# Patient Record
Sex: Female | Born: 1965 | Race: Black or African American | Hispanic: No | Marital: Married | Smoking: Never smoker
Health system: Southern US, Community
[De-identification: ages and names within clinical notes are randomized; demographics above are authoritative.]

## PROBLEM LIST (undated history)

## (undated) DIAGNOSIS — Z9049 Acquired absence of other specified parts of digestive tract: Secondary | ICD-10-CM

## (undated) DIAGNOSIS — N200 Calculus of kidney: Secondary | ICD-10-CM

## (undated) HISTORY — PX: ABDOMINAL HYSTERECTOMY: SHX81

## (undated) HISTORY — PX: APPENDECTOMY: SHX54

## (undated) HISTORY — PX: KIDNEY SURGERY: SHX687

## (undated) HISTORY — DX: Acquired absence of other specified parts of digestive tract: Z90.49

## (undated) HISTORY — PX: TOTAL ABDOMINAL HYSTERECTOMY: SHX209

## (undated) HISTORY — DX: Calculus of kidney: N20.0

---

## 2018-01-30 DIAGNOSIS — M5417 Radiculopathy, lumbosacral region: Secondary | ICD-10-CM | POA: Insufficient documentation

## 2018-01-30 DIAGNOSIS — M5126 Other intervertebral disc displacement, lumbar region: Secondary | ICD-10-CM | POA: Insufficient documentation

## 2020-11-09 DIAGNOSIS — N200 Calculus of kidney: Secondary | ICD-10-CM | POA: Insufficient documentation

## 2021-11-16 ENCOUNTER — Emergency Department (INDEPENDENT_AMBULATORY_CARE_PROVIDER_SITE_OTHER)
Admission: EM | Admit: 2021-11-16 | Discharge: 2021-11-16 | Disposition: A | Discharge status: Home / Self Care | Payer: PRIVATE HEALTH INSURANCE | Source: Home / Self Care | Attending: Family Medicine | Admitting: Family Medicine

## 2021-11-16 ENCOUNTER — Emergency Department (INDEPENDENT_AMBULATORY_CARE_PROVIDER_SITE_OTHER): Payer: PRIVATE HEALTH INSURANCE

## 2021-11-16 ENCOUNTER — Other Ambulatory Visit: Payer: Self-pay

## 2021-11-16 DIAGNOSIS — R109 Unspecified abdominal pain: Secondary | ICD-10-CM

## 2021-11-16 DIAGNOSIS — R1012 Left upper quadrant pain: Secondary | ICD-10-CM

## 2021-11-16 DIAGNOSIS — K5909 Other constipation: Secondary | ICD-10-CM

## 2021-11-16 LAB — POCT URINALYSIS DIP (MANUAL ENTRY)
Bilirubin, UA: NEGATIVE
Blood, UA: NEGATIVE
Glucose, UA: NEGATIVE mg/dL
Ketones, POC UA: NEGATIVE mg/dL
Leukocytes, UA: NEGATIVE
Nitrite, UA: NEGATIVE
Protein Ur, POC: NEGATIVE mg/dL
Spec Grav, UA: >=1.03 — AB (ref 1.010–1.025)
Urobilinogen, UA: 0.2 E.U./dL
pH, UA: 6 (ref 5.0–8.0)

## 2021-11-16 NOTE — ED Notes (Signed)
Pt to return at 3pm for lab draw- pt has not eaten or drank anything today. Pt requested lab draws be from hands only- currently veins are flat - Dr Cathren Harsh updated on pt lab status

## 2021-11-16 NOTE — ED Provider Notes (Addendum)
[facility]    CSN: [id number] Arrival date & time: 11/16/21  0918      History   Chief Complaint Chief Complaint  Patient presents with   Abdominal Pain    Left side abdominal pain that radiates to lower back. X2 months    HPI Nicole Matthews is a 56 y.o. female.   Patient complains of about 4 month history of persistent constipation, recently becoming worse despite daily Miralax (one scoop mixed in fluid).  During the past month she has had left upper abdominal pain that now radiates to her left flank.  Her bowel movements remain irregular.  She denies nausea/vomiting, fevers, chills, and sweats, and urinary symptoms.  Her abdominal pain is worse with movement and is not affected by eating. Past surgical history of abdominal hysterectomy, appendectomy, and kidney surgery. She and her husband are new to the area, without a local PCP although she has scheduled a PCP appointment to establish care  The history is provided by the patient and the spouse.  Abdominal Pain Pain location:  LUQ Pain quality: aching, bloating and dull   Pain radiates to:  L flank Pain severity:  Moderate Onset quality:  Gradual Duration:  8 weeks Timing:  Constant Progression:  Worsening Chronicity:  New Context: awakening from sleep, laxative use and previous surgery   Context: not diet changes, not eating, not recent illness, not recent travel and not suspicious food intake   Relieved by:  Nothing Worsened by:  Movement and position changes Ineffective treatments: Miralax. Associated symptoms: constipation   Associated symptoms: no anorexia, no belching, no chest pain, no chills, no diarrhea, no dysuria, no fatigue, no fever, no flatus, no hematemesis, no hematochezia, no hematuria, no melena, no nausea and no vomiting    History reviewed. No pertinent past medical history.  There are no problems to display for this patient.   Past Surgical History:  Procedure Laterality Date    ABDOMINAL HYSTERECTOMY     APPENDECTOMY     KIDNEY SURGERY      OB History   No obstetric history on file.      Home Medications    Prior to Admission medications   Medication Sig Start Date End Date Taking? Authorizing Provider  Acetaminophen (TYLENOL 8 HOUR PO) Take by mouth.    [provider]  Naproxen Sodium (ALEVE PO) Take by mouth.    [provider]    Family History History reviewed. No pertinent family history.  Social History Social History   Tobacco Use   Smoking status: Never   Smokeless tobacco: Never  Substance Use Topics   Alcohol use: Yes    Comment: occ   Drug use: Never     Allergies   Oxycodone-acetaminophen   Review of Systems Review of Systems  Constitutional:  Negative for chills, fatigue and fever.  Cardiovascular:  Negative for chest pain.  Gastrointestinal:  Positive for abdominal pain and constipation. Negative for abdominal distention, anorexia, blood in stool, diarrhea, flatus, hematemesis, hematochezia, melena, nausea and vomiting.  Genitourinary:  Negative for dysuria and hematuria.  Hematological:  Negative for adenopathy.  All other systems reviewed and are negative.   Physical Exam Triage Vital Signs ED Triage Vitals  Enc Vitals Group     BP 11/16/21 0947 130/83     Pulse Rate 11/16/21 0947 78     Resp 11/16/21 0947 18     Temp 11/16/21 0947 98.3 F (36.8 C)     Temp Source  11/16/21 0947 Oral     SpO2 11/16/21 0947 96 %     Weight 11/16/21 0943 150 lb (68 kg)     Height 11/16/21 0943 5\' 5"  (1.651 m)     Head Circumference --      Peak Flow --      Pain Score 11/16/21 0943 8     Pain Loc --      Pain Edu? --      Excl. in [place]? --    No data found.  Updated Vital Signs BP 130/83 (BP Location: Left Arm)    Pulse 78    Temp 98.3 F (36.8 C) (Oral)    Resp 18    Ht 5\' 5"  (1.651 m)    Wt 68 kg    SpO2 96%    BMI 24.96 kg/m   Visual Acuity Right Eye Distance:   Left Eye Distance:   Bilateral  Distance:    Right Eye Near:   Left Eye Near:    Bilateral Near:     Physical Exam Vitals and nursing note reviewed.  Constitutional:      General: She is not in acute distress. HENT:     Head: Normocephalic.     Mouth/Throat:     Mouth: Mucous membranes are moist.     Pharynx: Oropharynx is clear.  Eyes:     Conjunctiva/sclera: Conjunctivae normal.     Pupils: Pupils are equal, round, and reactive to light.  Cardiovascular:     Rate and Rhythm: Normal rate and regular rhythm.     Heart sounds: Normal heart sounds.  Pulmonary:     Breath sounds: Normal breath sounds.    Abdominal:     General: Abdomen is flat. Bowel sounds are normal. There is no distension.     Palpations: Abdomen is soft. There is no hepatomegaly or mass.     Tenderness: There is abdominal tenderness in the left upper quadrant and left lower quadrant. There is no right CVA tenderness, left CVA tenderness, guarding or rebound. Negative signs include McBurney's sign, psoas sign and obturator sign.     Hernia: There is no hernia in the umbilical area or ventral area.       Comments: Patient's abdominal tenderness extends to her left flank.  Musculoskeletal:     Cervical back: Neck supple.     Right lower leg: No edema.     Left lower leg: No edema.  Lymphadenopathy:     Cervical: No cervical adenopathy.  Skin:    General: Skin is warm and dry.     Findings: No rash.  Neurological:     Mental Status: She is alert and oriented to person, place, and time.     UC Treatments / Results  Labs (all labs ordered are listed, but only abnormal results are displayed) Labs Reviewed  POCT URINALYSIS DIP (MANUAL ENTRY) - Abnormal; Notable for the following components:      Result Value   Spec Grav, UA >=1.030 (*)    All other components within normal limits  TSH  T4, FREE  CBC WITH DIFFERENTIAL/PLATELET    EKG   Radiology DG Abd 2 Views  Result Date: 11/16/2021 CLINICAL DATA:  Left-sided abdominal  pain for few days EXAM: ABDOMEN - 2 VIEW COMPARISON:  None. FINDINGS: Nonobstructive pattern of bowel gas. Moderate burden of stool in the left and right colon. There is no evidence of free air. No radio-opaque calculi or other significant radiographic abnormality is seen. IMPRESSION:  Nonobstructive pattern of bowel gas. Moderate burden of stool in the left and right colon. Electronically Signed   By: Delanna Ahmadi M.D.   On: 11/16/2021 11:37    Procedures Procedures (including critical care time)  Medications Ordered in UC Medications - No data to display  Initial Impression / Assessment and Plan / UC Course  I have reviewed the triage vital signs and the nursing notes.  Pertinent labs & imaging results that were available during my care of the patient were reviewed by me and considered in my medical decision making (see chart for details).    Note abdominal x-ray findings of moderate stool burden in left and right colon with nonobstructive bowel gas pattern. In light of patient's 3 to 4 month history of worsening constipation, rule out hypothyroid. CBC, TSH, and free T4 pending. Followup with Family Doctor as scheduled.  Final Clinical Impressions(s) / UC Diagnoses   Final diagnoses:  Left upper quadrant abdominal pain  Left sided abdominal pain  Chronic constipation     Discharge Instructions      Try using a Dulcolax suppository.  May also use a Fleet's enema if necessary. Begin clear liquids for about 24 hours, then may begin a [employer] (Bananas, Rice, Applesauce, Toast) for about 24 hours. Then gradually advance to a regular diet as tolerated.    Continue taking daily Miralax, one capful mixed in 4 to 8 ounces of water until bowel movements are regular. Recommend increasing natural fiber in diet.  May also take a daily fiber product such as Citrucel with plenty of fluid.  If symptoms become significantly worse during the night or over the weekend, proceed to the local  emergency room.       ED Prescriptions   None       Kandra Nicolas, MD 11/18/21 1818  Addendum: Patient's Free T4 is low to mid-range:  1.0 ng/dL (normal 0.8 to 1.8) TSH is below the lower limit of normal:  0.37 mlU/L (normal 0.4 to 4.5) CBC normal  ?central hypothyroid.  Recommend referral to endocrinologist for further evaluation.    Kandra Nicolas, MD 11/18/21 [phone]

## 2021-11-16 NOTE — Discharge Instructions (Signed)
Try using a Dulcolax suppository.  May also use a Fleet's enema if necessary. Begin clear liquids for about 24 hours, then may begin a [employer] (Bananas, Rice, Applesauce, Toast) for about 24 hours. Then gradually advance to a regular diet as tolerated.    Continue taking daily Miralax, one capful mixed in 4 to 8 ounces of water until bowel movements are regular. Recommend increasing natural fiber in diet.  May also take a daily fiber product such as Citrucel with plenty of fluid.  If symptoms become significantly worse during the night or over the weekend, proceed to the local emergency room.

## 2021-11-16 NOTE — ED Triage Notes (Signed)
Pt states that she has some left side abdominal pain that radiates to her lower back. X2 months

## 2021-11-16 NOTE — ED Notes (Signed)
1445:Pt returned at 1445 for lab draw. Warm packs applied to bilateral hands - 1 attempt to left wrist - + blood return - Dr Assunta Found updated

## 2021-11-17 LAB — CBC WITH DIFFERENTIAL/PLATELET
Absolute Monocytes: 399 cells/uL (ref 200–950)
Basophils Absolute: 42 cells/uL (ref 0–200)
Basophils Relative: 0.6 %
Eosinophils Absolute: 42 cells/uL (ref 15–500)
Eosinophils Relative: 0.6 %
HCT: 40.3 % (ref 35.0–45.0)
Hemoglobin: 13.2 g/dL (ref 11.7–15.5)
Lymphs Abs: 2415 cells/uL (ref 850–3900)
MCH: 29.9 pg (ref 27.0–33.0)
MCHC: 32.8 g/dL (ref 32.0–36.0)
MCV: 91.2 fL (ref 80.0–100.0)
MPV: 10.9 fL (ref 7.5–12.5)
Monocytes Relative: 5.7 %
Neutro Abs: 4102 cells/uL (ref 1500–7800)
Neutrophils Relative %: 58.6 %
Platelets: 239 10*3/uL (ref 140–400)
RBC: 4.42 10*6/uL (ref 3.80–5.10)
RDW: 13.4 % (ref 11.0–15.0)
Total Lymphocyte: 34.5 %
WBC: 7 10*3/uL (ref 3.8–10.8)

## 2021-11-17 LAB — T4, FREE: Free T4: 1 ng/dL (ref 0.8–1.8)

## 2021-11-17 LAB — TSH: TSH: 0.37 mIU/L — ABNORMAL LOW (ref 0.40–4.50)

## 2021-11-19 ENCOUNTER — Telehealth: Payer: Self-pay

## 2021-11-19 NOTE — Telephone Encounter (Signed)
Called pt to inform her that Dr. Assunta Found would like for her to schedule an appt with an Endocrinologist regarding abnormal thyroid test.  Called pt. No answer. Left message to call office. LM

## 2022-01-08 ENCOUNTER — Ambulatory Visit (INDEPENDENT_AMBULATORY_CARE_PROVIDER_SITE_OTHER): Payer: MEDICARE | Admitting: Medical-Surgical

## 2022-01-08 ENCOUNTER — Other Ambulatory Visit: Payer: Self-pay

## 2022-01-08 ENCOUNTER — Encounter: Payer: Self-pay | Admitting: Medical-Surgical

## 2022-01-08 VITALS — BP 121/77 | HR 82 | Resp 20 | Ht 65.0 in | Wt 174.3 lb

## 2022-01-08 DIAGNOSIS — Z87442 Personal history of urinary calculi: Secondary | ICD-10-CM | POA: Diagnosis not present

## 2022-01-08 DIAGNOSIS — Z7689 Persons encountering health services in other specified circumstances: Secondary | ICD-10-CM

## 2022-01-08 DIAGNOSIS — M545 Low back pain, unspecified: Secondary | ICD-10-CM

## 2022-01-08 DIAGNOSIS — Z23 Encounter for immunization: Secondary | ICD-10-CM | POA: Diagnosis not present

## 2022-01-08 NOTE — Progress Notes (Addendum)
? ?New Patient Office Visit ? ?Subjective:  ?Patient ID: Nicole Matthews, female    DOB: September 10, 1966  Age: 56 y.o. MRN: [id number] ? ?CC: Establish care ? ? ?HPI ?Nicole Matthews presents to establish care. She recently moved here from [place] with her husband. She has a history of kidney stones that have to be removed surgical due to her inability to pass them. 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 She reports still having "4 stones that were not big enough to be surgically removed." She reports going to urgent care last month for  left sided pain that was not tolerable. The xray showed constipation, but she does not feel like that was the cause. She reports still having left sided back pain that sometimes radiates to her left side and her stomach. She uses a heat pack and aleve and tylenol with little relief. She reports a history of UTI and bladder infection due to the calcium stones, but does not believe this is what is causing the pain. She reports occasional nausea due to the pain. She denies any urinary symptoms.  ? ?Past Medical History:  ?Diagnosis Date  ? History of appendectomy   ? Kidney stones   ? ? ?Past Surgical History:  ?Procedure Laterality Date  ? ABDOMINAL HYSTERECTOMY    ? ? ?History reviewed. No pertinent family history. ? ?Social History  ? ?Socioeconomic History  ? Marital status: Married  ?  Spouse name: Not on file  ? Number of children: Not on file  ? Years of education: Not on file  ? Highest education level: Not on file  ?Occupational History  ? Not on file  ?Tobacco Use  ? Smoking status: Never  ? Smokeless tobacco: Never  ?Substance and Sexual Activity  ? Alcohol use: Yes  ?  Alcohol/week: 2.0 standard drinks  ?  Types: 2 Standard drinks or equivalent per week  ?  Comment: Sometimes  ? Drug use: Never  ? Sexual activity: Yes  ?  Birth control/protection: None  ?  Comment: Hysterectomy  ?Other Topics Concern  ? Not on file  ?Social History Narrative  ?  Not on file  ? ?Social Determinants of Health  ? ?Financial Resource Strain: Not on file  ?Food Insecurity: Not on file  ?Transportation Needs: Not on file  ?Physical Activity: Not on file  ?Stress: Not on file  ?Social Connections: Not on file  ?Intimate Partner Violence: Not on file  ? ? ?ROS ?Review of Systems: ?Constitutional:  No  fever, no chills, No recent illness, No unintentional weight changes. No significant fatigue.  ?HEENT: No  headache, no vision change, no hearing change, No sore throat, No  sinus pressure, + ear itching ?Cardiac: No  chest pain, No  pressure, No palpitations, No  Orthopnea ?Respiratory:  No  shortness of breath. No  Cough ?Gastrointestinal: No  vomiting,  No  blood in stool, No  diarrhea, No  constipation. Positive for occasional nausea and abdominal pain ?Musculoskeletal:Positive for left lower back pain, left side pain ?Skin: No  Rash, No other wounds/concerning lesions ?Genitourinary: No  incontinence, No  abnormal genital bleeding, No abnormal genital discharge ?Hem/Onc: No  easy bruising/bleeding, No  abnormal lymph node ?Endocrine: No cold intolerance,  No heat intolerance. No polyuria/polydipsia/polyphagia  ?Neurologic: No  weakness, No  dizziness, No  slurred speech/focal weakness/facial droop ?Psychiatric: No  concerns with depression, No  concerns with anxiety, No sleep problems, No mood problems ? ?Objective:  ? ?  Today's Vitals: BP 121/77   Pulse 82   Resp 20   Ht '5\' 5"'$  (1.651 m)   Wt 79.1 kg   SpO2 97%   BMI 29.01 kg/m?  ? ?Physical Exam ?Constitutional:   ?   Appearance: Normal appearance.  ?HENT:  ?   Head: Normocephalic and atraumatic.  ?Cardiovascular:  ?   Rate and Rhythm: Normal rate and regular rhythm.  ?   Pulses: Normal pulses.  ?   Heart sounds: Normal heart sounds.  ?Pulmonary:  ?   Effort: Pulmonary effort is normal.  ?   Breath sounds: Normal breath sounds.  ?Abdominal:  ?   General: Bowel sounds are normal.  ?   Palpations: Abdomen is soft.  ?    Tenderness: There is abdominal tenderness.  ?   Comments: Tender to palpate LUQ/LLQ  ?Musculoskeletal:     ?   General: Tenderness present.  ?   Comments: Left lower back tender to palpate  ?Skin: ?   General: Skin is warm and dry.  ?Neurological:  ?   Mental Status: She is alert and oriented to person, place, and time.  ? ? ?Assessment & Plan:  ? ?1. Encounter to establish care ?Reviewed available information and discussed care concerns with patient.  ? ?2. History of kidney stones ?3. Left-sided low back pain without sciatica, unspecified chronicity ?Discussed imaging vs. referral. Patient would prefer referral due to tolerable pain. Will send referral to nephrology and urology. ? ?4. Need for zoster vaccination ?After obtaining informed consent, the immunization is given today ?- Varicella-zoster vaccine IM (Shingrix) ? ?5. Need for tetanus booster ?After obtaining informed consent, the immunization is given today ?- Tdap vaccine greater than or equal to 7yo IM ? ? ? ? ?Follow-up: Return for annual physical exam at your convenience.  ? ?Jeanann Lewandowsky, Student NP ? ?

## 2022-01-08 NOTE — Progress Notes (Signed)
Erroneous note

## 2022-01-08 NOTE — Progress Notes (Signed)
Medical screening examination/treatment was performed by qualified nurse practitioner student and as supervising provider I was immediately available for consultation/collaboration. I have reviewed documentation and agree with assessment and plan. ° °Athelene Hursey L. [place] Surges, DNP, APRN, FNP-BC °[facility] °Primary Care and Sports Medicine ° °

## 2022-01-09 ENCOUNTER — Encounter: Payer: Self-pay | Admitting: Medical-Surgical

## 2022-01-28 ENCOUNTER — Other Ambulatory Visit: Payer: Self-pay | Admitting: Medical-Surgical

## 2022-01-28 DIAGNOSIS — M545 Low back pain, unspecified: Secondary | ICD-10-CM

## 2022-01-28 DIAGNOSIS — Z87442 Personal history of urinary calculi: Secondary | ICD-10-CM

## 2022-02-04 ENCOUNTER — Other Ambulatory Visit: Payer: Self-pay | Admitting: Medical-Surgical

## 2022-02-04 DIAGNOSIS — Z1231 Encounter for screening mammogram for malignant neoplasm of breast: Secondary | ICD-10-CM

## 2022-02-05 ENCOUNTER — Telehealth: Payer: Self-pay

## 2022-02-05 NOTE — Telephone Encounter (Signed)
Advised patient that Nicole Matthews has placed orders for a BMP and the patient will be here in the morning around 10. ?

## 2022-02-07 LAB — BASIC METABOLIC PANEL WITH GFR
BUN/Creatinine Ratio: 15 (calc) (ref 6–22)
BUN: 18 mg/dL (ref 7–25)
CO2: 24 mmol/L (ref 20–32)
Calcium: 9.6 mg/dL (ref 8.6–10.4)
Chloride: 108 mmol/L (ref 98–110)
Creat: 1.22 mg/dL — ABNORMAL HIGH (ref 0.50–1.03)
Glucose, Bld: 81 mg/dL (ref 65–99)
Potassium: 4.4 mmol/L (ref 3.5–5.3)
Sodium: 141 mmol/L (ref 135–146)
eGFR: 52 mL/min/{1.73_m2} — ABNORMAL LOW (ref >=60)

## 2022-02-17 DIAGNOSIS — Z87442 Personal history of urinary calculi: Secondary | ICD-10-CM | POA: Insufficient documentation

## 2022-02-17 DIAGNOSIS — Z8739 Personal history of other diseases of the musculoskeletal system and connective tissue: Secondary | ICD-10-CM | POA: Insufficient documentation

## 2022-02-17 DIAGNOSIS — R109 Unspecified abdominal pain: Secondary | ICD-10-CM | POA: Insufficient documentation

## 2022-02-17 DIAGNOSIS — M5416 Radiculopathy, lumbar region: Secondary | ICD-10-CM | POA: Insufficient documentation

## 2022-02-17 DIAGNOSIS — R911 Solitary pulmonary nodule: Secondary | ICD-10-CM | POA: Insufficient documentation

## 2022-02-17 DIAGNOSIS — E278 Other specified disorders of adrenal gland: Secondary | ICD-10-CM | POA: Insufficient documentation

## 2022-03-07 ENCOUNTER — Encounter: Payer: Self-pay | Admitting: Medical-Surgical

## 2022-03-28 ENCOUNTER — Ambulatory Visit (INDEPENDENT_AMBULATORY_CARE_PROVIDER_SITE_OTHER): Payer: MEDICARE

## 2022-03-28 DIAGNOSIS — Z1231 Encounter for screening mammogram for malignant neoplasm of breast: Secondary | ICD-10-CM

## 2022-04-11 ENCOUNTER — Other Ambulatory Visit: Payer: Self-pay

## 2022-04-11 DIAGNOSIS — Z1211 Encounter for screening for malignant neoplasm of colon: Secondary | ICD-10-CM

## 2022-04-11 NOTE — Progress Notes (Signed)
Patient called wanting a referral for a colonoscopy

## 2022-04-29 ENCOUNTER — Telehealth: Payer: Self-pay | Admitting: Internal Medicine

## 2022-04-29 NOTE — Telephone Encounter (Signed)
Hi Dr. Hilarie Fredrickson,  Supervising provider:   We received a referral for patient to have another colonoscopy. She had one done back in 2018 at [facility] in [place]. Reports were obtained for you to review and advise on scheduling.    Thank you

## 2022-06-04 ENCOUNTER — Ambulatory Visit (AMBULATORY_SURGERY_CENTER): Payer: MEDICARE

## 2022-06-04 VITALS — Ht 65.0 in | Wt 175.0 lb

## 2022-06-04 DIAGNOSIS — Z86010 Personal history of colonic polyps: Secondary | ICD-10-CM

## 2022-06-04 MED ORDER — NA SULFATE-K SULFATE-MG SULF 17.5-3.13-1.6 GM/177ML PO SOLN: 1.0000 | ORAL | 0 refills | Status: DC

## 2022-06-04 NOTE — Progress Notes (Signed)
No egg or soy allergy known to patient  No issues known to pt with past sedation with any surgeries or procedures Patient denies ever being told they had issues or difficulty with intubation  No FH of Malignant Hyperthermia Pt is not on diet pills Pt is not on  home 02  Pt is not on blood thinners  Pt denies issues with constipation  No A fib or A flutter Have any cardiac testing pending--denied Pt instructed to use Singlecare.[email or web address] or GoodRx for a price reduction on prep   

## 2022-06-19 ENCOUNTER — Encounter: Payer: Self-pay | Admitting: Internal Medicine

## 2022-07-01 ENCOUNTER — Encounter: Payer: Self-pay | Admitting: Internal Medicine

## 2022-07-01 ENCOUNTER — Ambulatory Visit (AMBULATORY_SURGERY_CENTER): Payer: PRIVATE HEALTH INSURANCE | Admitting: Internal Medicine

## 2022-07-01 VITALS — BP 130/64 | HR 58 | Temp 98.0°F | Resp 13

## 2022-07-01 DIAGNOSIS — Z86010 Personal history of colonic polyps: Secondary | ICD-10-CM

## 2022-07-01 DIAGNOSIS — Z09 Encounter for follow-up examination after completed treatment for conditions other than malignant neoplasm: Secondary | ICD-10-CM

## 2022-07-01 DIAGNOSIS — K635 Polyp of colon: Secondary | ICD-10-CM | POA: Diagnosis not present

## 2022-07-01 DIAGNOSIS — D122 Benign neoplasm of ascending colon: Secondary | ICD-10-CM

## 2022-07-01 MED ORDER — SODIUM CHLORIDE 0.9 % IV SOLN: 500.0000 mL | INTRAVENOUS | Status: DC

## 2022-07-01 NOTE — Op Note (Signed)
[name]: Nicole Matthews Procedure Date: 07/01/2022 1:56 PM MRN: [id number] Endoscopist: Jerene Bears , MD Age: 56 Referring MD:  Date of Birth: [birth year] Gender: Female Account #: [id number] Procedure:                Colonoscopy Indications:              High risk colon cancer surveillance: Personal                            history of non-advanced adenomas, Last colonoscopy:                            2018 (in [place]) Medicines:                Monitored Anesthesia Care Procedure:                Pre-Anesthesia Assessment:                           - Prior to the procedure, a History and Physical                            was performed, and patient medications and                            allergies were reviewed. The patient's tolerance of                            previous anesthesia was also reviewed. The risks                            and benefits of the procedure and the sedation                            options and risks were discussed with the patient.                            All questions were answered, and informed consent                            was obtained. Prior Anticoagulants: The patient has                            taken no previous anticoagulant or antiplatelet                            agents. ASA Grade Assessment: I - A normal, healthy                            patient. After reviewing the risks and benefits,                            the patient was deemed in satisfactory condition to  undergo the procedure.                           After obtaining informed consent, the colonoscope                            was passed under direct vision. Throughout the                            procedure, the patient's blood pressure, pulse, and                            oxygen saturations were monitored continuously. The                            PCF-HQ190L Colonoscope was introduced through the                             anus and advanced to the cecum, identified by                            appendiceal orifice and ileocecal valve. The                            colonoscopy was performed without difficulty. The                            patient tolerated the procedure well. The quality                            of the bowel preparation was excellent. The                            ileocecal valve, appendiceal orifice, and rectum                            were photographed. Scope In: 2:22:05 PM Scope Out: 2:35:02 PM Scope Withdrawal Time: 0 hours 9 minutes 30 seconds  Total Procedure Duration: 0 hours 12 minutes 57 seconds  Findings:                 The digital rectal exam was normal.                           A 2 mm polyp was found in the ascending colon. The                            polyp was sessile. The polyp was removed with a                            cold biopsy forceps. Resection and retrieval were                            complete.  A few small-mouthed diverticula were found in the                            sigmoid colon.                           Internal hemorrhoids were found during                            retroflexion. The hemorrhoids were small.                           The exam was otherwise without abnormality. Complications:            No immediate complications. Estimated Blood Loss:     Estimated blood loss: none. Impression:               - One 2 mm polyp in the ascending colon, removed                            with a cold biopsy forceps. Resected and retrieved.                           - Diverticulosis in the sigmoid colon.                           - Small internal hemorrhoids.                           - The examination was otherwise normal. Recommendation:           - Patient has a contact number available for                            emergencies. The signs and symptoms of potential                            delayed  complications were discussed with the                            patient. Return to normal activities tomorrow.                            Written discharge instructions were provided to the                            patient.                           - Resume previous diet.                           - Continue present medications.                           - Await pathology results.                           -  Repeat colonoscopy is recommended for                            surveillance. The colonoscopy date will be                            determined after pathology results from today's                            exam become available for review. Jerene Bears, MD 07/01/2022 2:38:13 PM This report has been signed electronically.

## 2022-07-01 NOTE — Progress Notes (Signed)
Pt to PACU, Ar[place]. Report given to RN. SBAR complete. All questions answered. Pt VSS. Airway intact.

## 2022-07-01 NOTE — Patient Instructions (Signed)
Resume previous medications.  1 polyp removed and sent to pathology.  Await results for final recommendations.    Handouts on findings given to patient.   (Polyps, hemorrhoids, diverticulosis)   YOU HAD AN ENDOSCOPIC PROCEDURE TODAY AT [facility]:   Refer to the procedure report that was given to you for any specific questions about what was found during the examination.  If the procedure report does not answer your questions, please call your gastroenterologist to clarify.  If you requested that your care partner not be given the details of your procedure findings, then the procedure report has been included in a sealed envelope for you to review at your convenience later.  YOU SHOULD EXPECT: Some feelings of bloating in the abdomen. Passage of more gas than usual.  Walking can help get rid of the air that was put into your GI tract during the procedure and reduce the bloating. If you had a lower endoscopy (such as a colonoscopy or flexible sigmoidoscopy) you may notice spotting of blood in your stool or on the toilet paper. If you underwent a bowel prep for your procedure, you may not have a normal bowel movement for a few days.  Please Note:  You might notice some irritation and congestion in your nose or some drainage.  This is from the oxygen used during your procedure.  There is no need for concern and it should clear up in a day or so.  SYMPTOMS TO REPORT IMMEDIATELY:  Following lower endoscopy (colonoscopy or flexible sigmoidoscopy):  Excessive amounts of blood in the stool  Significant tenderness or worsening of abdominal pains  Swelling of the abdomen that is new, acute  Fever of 100F or higher   For urgent or emergent issues, a gastroenterologist can be reached at any hour by calling [phone]. Do not use MyChart messaging for urgent concerns.    DIET:  We do recommend a small meal at first, but then you may proceed to your regular diet.  Drink plenty of  fluids but you should avoid alcoholic beverages for 24 hours.  ACTIVITY:  You should plan to take it easy for the rest of today and you should NOT DRIVE or use heavy machinery until tomorrow (because of the sedation medicines used during the test).    FOLLOW UP: Our staff will call the number listed on your records the next business day following your procedure.  We will call around 7:15- 8:00 am to check on you and address any questions or concerns that you may have regarding the information given to you following your procedure. If we do not reach you, we will leave a message.     If any biopsies were taken you will be contacted by phone or by letter within the next 1-3 weeks.  Please call us at [phone] if you have not heard about the biopsies in 3 weeks.    SIGNATURES/CONFIDENTIALITY: You and/or your care partner have signed paperwork which will be entered into your electronic medical record.  These signatures attest to the fact that that the information above on your After Visit Summary has been reviewed and is understood.  Full responsibility of the confidentiality of this discharge information lies with you and/or your care-partner.

## 2022-07-01 NOTE — Progress Notes (Signed)
Called to room to assist during endoscopic procedure.  Patient ID and intended procedure confirmed with present staff. Received instructions for my participation in the procedure from the performing physician.  

## 2022-07-01 NOTE — Progress Notes (Signed)
GASTROENTEROLOGY PROCEDURE H&P NOTE   Primary Care Physician: Samuel Bouche, NP    Reason for Procedure:  History of colon polyps  Plan:    Colonoscopy  Patient is appropriate for endoscopic procedure(s) in the ambulatory ([facility]) setting.  The nature of the procedure, as well as the risks, benefits, and alternatives were carefully and thoroughly reviewed with the patient. Ample time for discussion and questions allowed. The patient understood, was satisfied, and agreed to proceed.     HPI: Nicole Matthews is a 56 y.o. female who presents for surveillance colonoscopy.  Medical history as below.  Tolerated the prep.  No recent chest pain or shortness of breath.  No abdominal pain today.  Past Medical History:  Diagnosis Date   History of appendectomy    Kidney stones     Past Surgical History:  Procedure Laterality Date   ABDOMINAL HYSTERECTOMY     APPENDECTOMY     KIDNEY SURGERY     TOTAL ABDOMINAL HYSTERECTOMY      Prior to Admission medications   Medication Sig Start Date End Date Taking? Authorizing Provider  Acetaminophen (TYLENOL 8 HOUR PO) Take by mouth. Patient not taking: Reported on 07/01/2022    [provider]  esomeprazole (NEXIUM) 40 MG capsule Take by mouth. Patient not taking: Reported on 07/01/2022    [provider]  Naproxen Sodium (ALEVE PO) Take by mouth.    [provider]  neomycin-polymyxin-dexameth (MAXITROL) 0.1 % OINT     [provider]    Current Outpatient Medications  Medication Sig Dispense Refill   Acetaminophen (TYLENOL 8 HOUR PO) Take by mouth. (Patient not taking: Reported on 07/01/2022)     esomeprazole (NEXIUM) 40 MG capsule Take by mouth. (Patient not taking: Reported on 07/01/2022)     Naproxen Sodium (ALEVE PO) Take by mouth.     neomycin-polymyxin-dexameth (MAXITROL) 0.1 % OINT  (Patient not taking: Reported on 07/01/2022)     Current Facility-Administered Medications  Medication Dose Route  Frequency Provider Last Rate Last Admin   0.9 %  sodium chloride infusion  500 mL Intravenous Continuous Brelan Hannen, Lajuan Lines, MD        Allergies as of 07/01/2022 - Review Complete 07/01/2022  Allergen Reaction Noted   Percocet [oxycodone-acetaminophen] Rash 01/08/2022   Oxycodone-acetaminophen Other (See Comments) 11/09/2020    Family History  Problem Relation Age of Onset   Colon cancer Neg Hx    Colon polyps Neg Hx    Esophageal cancer Neg Hx    Stomach cancer Neg Hx    Rectal cancer Neg Hx     Social History   Socioeconomic History   Marital status: Married    Spouse name: Not on file   Number of children: Not on file   Years of education: Not on file   Highest education level: Not on file  Occupational History   Not on file  Tobacco Use   Smoking status: Never   Smokeless tobacco: Never  Substance and Sexual Activity   Alcohol use: Yes    Alcohol/week: 2.0 standard drinks of alcohol    Types: 2 Standard drinks or equivalent per week    Comment: Sometimes   Drug use: Never   Sexual activity: Yes    Birth control/protection: None    Comment: Hysterectomy  Other Topics Concern   Not on file  Social History Narrative   ** Merged History Encounter **       Social Determinants of Health  Financial Resource Strain: Not on file  Food Insecurity: Not on file  Transportation Needs: Not on file  Physical Activity: Not on file  Stress: Not on file  Social Connections: Not on file  Intimate Partner Violence: Not on file    Physical Exam: Vital signs in last 24 hours: '@Temp'$  79 F (36.7 C)  GEN: NAD EYE: Sclerae anicteric ENT: MMM CV: Non-tachycardic Pulm: CTA b/l GI: Soft, NT/ND NEURO:  Alert & Oriented x 3   Zenovia Jarred, MD [facility]  07/01/2022 2:07 PM

## 2022-07-02 ENCOUNTER — Telehealth: Payer: Self-pay

## 2022-07-02 NOTE — Telephone Encounter (Signed)
  Follow up Call-     07/01/2022    1:05 PM  Call back number  Post procedure Call Back phone  # [phone]  Permission to leave phone message Yes     Patient questions:  Do you have a fever, pain , or abdominal swelling? No. Pain Score  0 *  Have you tolerated food without any problems? Yes.    Have you been able to return to your normal activities? Yes.    Do you have any questions about your discharge instructions: Diet   No. Medications  No. Follow up visit  No.  Do you have questions or concerns about your Care? No.  Actions: * If pain score is 4 or above: No action needed, pain <4.

## 2022-07-08 ENCOUNTER — Encounter: Payer: Self-pay | Admitting: Internal Medicine

## 2022-08-02 ENCOUNTER — Ambulatory Visit (INDEPENDENT_AMBULATORY_CARE_PROVIDER_SITE_OTHER): Payer: 59 | Admitting: Sports Medicine

## 2022-08-02 ENCOUNTER — Encounter: Payer: Self-pay | Admitting: Sports Medicine

## 2022-08-02 DIAGNOSIS — H8112 Benign paroxysmal vertigo, left ear: Secondary | ICD-10-CM

## 2022-08-02 DIAGNOSIS — H8110 Benign paroxysmal vertigo, unspecified ear: Secondary | ICD-10-CM | POA: Insufficient documentation

## 2022-08-02 MED ORDER — PROCHLORPERAZINE MALEATE 10 MG PO TABS: 10.0000 mg | ORAL_TABLET | Freq: Four times a day (QID) | ORAL | 3 refills | Status: DC | PRN

## 2022-08-02 MED ORDER — PREDNISONE 50 MG PO TABS: 50.0000 mg | ORAL_TABLET | Freq: Every day | ORAL | 0 refills | Status: DC

## 2022-08-02 MED ORDER — DIAZEPAM 5 MG PO TABS: 5.0000 mg | ORAL_TABLET | Freq: Three times a day (TID) | ORAL | 0 refills | Status: DC | PRN

## 2022-08-02 NOTE — Assessment & Plan Note (Signed)
This is a very pleasant 56 year old female, for the past week and a half she has had an abnormal sensation of the room spinning, she feels off balance, described as dizziness. She also endorses some ringing in her ears and pressure sensation. No overt changing in her hearing. She did tell me she got some new contact lenses, and was wondering if this was the cause however I think that her symptoms are more due to the change in head position when putting on her contacts. On exam today her ear canals were clear, oropharyngeal exam was negative, no frontal or maxillary sinus tenderness. She had a strongly positive Dix-Hallpike sign to the left. Neurologic exam is nonfocal. Suspect BPPV, will treat this aggressively with prednisone, Valium as a vestibular suppressant, Compazine for nausea/seasickness. I would also like her to work with physical therapy/vestibular rehab.

## 2022-08-02 NOTE — Progress Notes (Signed)
    Procedures performed today:    None.  Independent interpretation of notes and tests performed by another provider:   None.  Brief History, Exam, Impression, and Recommendations:    Benign paroxysmal positional vertigo This is a very pleasant 56 year old female, for the past week and a half she has had an abnormal sensation of the room spinning, she feels off balance, described as dizziness. She also endorses some ringing in her ears and pressure sensation. No overt changing in her hearing. She did tell me she got some new contact lenses, and was wondering if this was the cause however I think that her symptoms are more due to the change in head position when putting on her contacts. On exam today her ear canals were clear, oropharyngeal exam was negative, no frontal or maxillary sinus tenderness. She had a strongly positive Dix-Hallpike sign to the left. Neurologic exam is nonfocal. Suspect BPPV, will treat this aggressively with prednisone, Valium as a vestibular suppressant, Compazine for nausea/seasickness. I would also like her to work with physical therapy/vestibular rehab.    ____________________________________________ Gwen Her. Dianah Field, M.D., ABFM., CAQSM., AME. Primary Care and Sports Medicine [facility]  Adjunct Professor of [employer]  Risk manager

## 2022-09-16 ENCOUNTER — Ambulatory Visit (INDEPENDENT_AMBULATORY_CARE_PROVIDER_SITE_OTHER): Payer: PRIVATE HEALTH INSURANCE | Admitting: Sports Medicine

## 2022-09-16 DIAGNOSIS — Z23 Encounter for immunization: Secondary | ICD-10-CM | POA: Diagnosis not present

## 2022-09-16 NOTE — Progress Notes (Signed)
Patient here for 2nd Shingrix vaccine  Location: Right Deltoid

## 2022-10-22 ENCOUNTER — Other Ambulatory Visit: Payer: Self-pay | Admitting: Sports Medicine

## 2022-10-22 ENCOUNTER — Telehealth: Payer: Self-pay

## 2022-10-22 DIAGNOSIS — H8112 Benign paroxysmal vertigo, left ear: Secondary | ICD-10-CM

## 2022-10-22 NOTE — Telephone Encounter (Signed)
Patient called requesting a refill on Prednisone because she has vertigo and she is very dizzy.

## 2022-10-23 ENCOUNTER — Ambulatory Visit (INDEPENDENT_AMBULATORY_CARE_PROVIDER_SITE_OTHER): Payer: 59 | Admitting: Family Medicine

## 2022-10-23 ENCOUNTER — Encounter: Payer: Self-pay | Admitting: Family Medicine

## 2022-10-23 VITALS — BP 137/80 | HR 70 | Ht 65.0 in | Wt 176.0 lb

## 2022-10-23 DIAGNOSIS — H6121 Impacted cerumen, right ear: Secondary | ICD-10-CM

## 2022-10-23 DIAGNOSIS — H8112 Benign paroxysmal vertigo, left ear: Secondary | ICD-10-CM | POA: Diagnosis not present

## 2022-10-23 MED ORDER — DIAZEPAM 5 MG PO TABS: 5.0000 mg | ORAL_TABLET | Freq: Three times a day (TID) | ORAL | 0 refills | Status: DC | PRN

## 2022-10-23 MED ORDER — PREDNISONE 50 MG PO TABS: ORAL_TABLET | ORAL | 0 refills | Status: DC

## 2022-10-23 NOTE — Assessment & Plan Note (Addendum)
She does have cerumen impaction which may be contributing some.  Lavage  attempted but she could not tolerate this, nor instrumentation with curette.  ENT referral placed. Adding prednisone back on.  Discussed that diazepam may help with symptoms.  Given note to remain out of work until early next week and avoid driving until symptom free. Referral placed for vestibular rehab.

## 2022-10-23 NOTE — Progress Notes (Signed)
Nicole Matthews - 57 y.o. female MRN [id number]  Date of birth: [birth year]  Subjective Chief Complaint  Patient presents with   Ear Pain    HPI Nicole Matthews is a 57 y.o. female here today with complaint of dizziness.  Symptoms started yesterday.  She has had similar symptoms in October of last year and seen by Dr. Dianah Field.  Positive Dix-Hallpike to the L.  Treated with prednisone.  Prescribed valium but didn't use this. Referred to vestibular rehab but doesn't appear that she went for this. She does get some nausea associated with this.  Denies falls.  No other neurological symptoms.   ROS:  A comprehensive ROS was completed and negative except as noted per HPI  Allergies  Allergen Reactions   Percocet [Oxycodone-Acetaminophen] Rash   Oxycodone-Acetaminophen Other (See Comments)    Past Medical History:  Diagnosis Date   History of appendectomy    Kidney stones     Past Surgical History:  Procedure Laterality Date   ABDOMINAL HYSTERECTOMY     APPENDECTOMY     KIDNEY SURGERY     TOTAL ABDOMINAL HYSTERECTOMY      Social History   Socioeconomic History   Marital status: Married    Spouse name: Not on file   Number of children: Not on file   Years of education: Not on file   Highest education level: Not on file  Occupational History   Not on file  Tobacco Use   Smoking status: Never   Smokeless tobacco: Never  Substance and Sexual Activity   Alcohol use: Yes    Alcohol/week: 2.0 standard drinks of alcohol    Types: 2 Standard drinks or equivalent per week    Comment: Sometimes   Drug use: Never   Sexual activity: Yes    Birth control/protection: None    Comment: Hysterectomy  Other Topics Concern   Not on file  Social History Narrative   ** Merged History Encounter **       Social Determinants of Health   Financial Resource Strain: Not on file  Food Insecurity: Not on file  Transportation Needs: Not on file  Physical Activity: Not on file  Stress: Not  on file  Social Connections: Not on file    Family History  Problem Relation Age of Onset   Colon cancer Neg Hx    Colon polyps Neg Hx    Esophageal cancer Neg Hx    Stomach cancer Neg Hx    Rectal cancer Neg Hx     Health Maintenance  Topic Date Due   HIV Screening  Never done   Hepatitis C Screening  Never done   INFLUENZA VACCINE  01/12/2023 (Originally 05/14/2022)   MAMMOGRAM  03/28/2024   DTaP/Tdap/Td (2 - Td or Tdap) 01/09/2032   COLONOSCOPY (Pts 45-27yr Insurance coverage will need to be confirmed)  07/01/2032   Zoster Vaccines- Shingrix  Completed   HPV VACCINES  Aged Out     ----------------------------------------------------------------------------------------------------------------------------------------------------------------------------------------------------------------- Physical Exam BP 137/80 (BP Location: Left Arm, Patient Position: Sitting, Cuff Size: Normal)   Pulse 70   Ht '5\' 5"'$  (1.651 m)   Wt 176 lb (79.8 kg)   SpO2 98%   BMI 29.29 kg/m   Physical Exam Constitutional:      Appearance: Normal appearance.  HENT:     Ears:     Comments: Cerumen impaction of R canal.   Cardiovascular:     Rate and Rhythm: Normal rate and regular rhythm.  Neurological:  Mental Status: She is alert.     Comments: Non focal neuro exam. Strongly + Marye Round with rotational nystagmus on the L Mildly positive Dix hallpike on the R.    Psychiatric:        Mood and Affect: Mood normal.        Behavior: Behavior normal.     ------------------------------------------------------------------------------------------------------------------------------------------------------------------------------------------------------------------- Assessment and Plan  Benign paroxysmal positional vertigo She does have cerumen impaction which may be contributing some.  Lavage  attempted but she could not tolerate this, nor instrumentation with curette.  Adding prednisone  back on.  Discussed that diazepam may help with symptoms.  Given note to remain out of work until early next week and avoid driving until symptom free. Referral placed for vestibular rehab.    Meds ordered this encounter  Medications   predniSONE (DELTASONE) 50 MG tablet    Sig: Take '50mg'$  daily x5 days.    Dispense:  5 tablet    Refill:  0   diazepam (VALIUM) 5 MG tablet    Sig: Take 1 tablet (5 mg total) by mouth every 8 (eight) hours as needed (vertigo/dizziness).    Dispense:  30 tablet    Refill:  0    No follow-ups on file.    This visit occurred during the SARS-CoV-2 public health emergency.  Safety protocols were in place, including screening questions prior to the visit, additional usage of staff PPE, and extensive cleaning of exam room while observing appropriate contact time as indicated for disinfecting solutions. ]

## 2022-10-23 NOTE — Patient Instructions (Addendum)
Start prednisone daily.  Use diazepam as needed for symptoms of dizziness.  Do not drive until symptom free.  Referral has been placed for vestibular rehab to help keep this from re-occurring.  Try debrox drops in the R ear.  This is available OTC.

## 2022-10-24 ENCOUNTER — Ambulatory Visit: Payer: 59 | Attending: Family Medicine | Admitting: Physical Therapy

## 2022-10-24 ENCOUNTER — Other Ambulatory Visit: Payer: Self-pay

## 2022-10-24 ENCOUNTER — Encounter: Payer: Self-pay | Admitting: Physical Therapy

## 2022-10-24 DIAGNOSIS — H8112 Benign paroxysmal vertigo, left ear: Secondary | ICD-10-CM | POA: Insufficient documentation

## 2022-10-24 DIAGNOSIS — R42 Dizziness and giddiness: Secondary | ICD-10-CM | POA: Diagnosis present

## 2022-10-24 DIAGNOSIS — R2681 Unsteadiness on feet: Secondary | ICD-10-CM | POA: Diagnosis present

## 2022-10-24 NOTE — Therapy (Signed)
OUTPATIENT PHYSICAL THERAPY VESTIBULAR EVALUATION  Patient Name: Nicole Matthews MRN: [id number] DOB:06-26-1966, 57 y.o., female Today's Date: 10/25/2022  END OF SESSION:  PT End of Session - 10/24/22 1451     Visit Number 1    Number of Visits 4    Date for PT Re-Evaluation 11/22/22    Authorization Type [employer]    PT Start Time 1450    PT Stop Time 1530    PT Time Calculation (min) 40 min    Activity Tolerance Patient tolerated treatment well    Behavior During Therapy [facility] for tasks assessed/performed             Past Medical History:  Diagnosis Date   History of appendectomy    Kidney stones    Past Surgical History:  Procedure Laterality Date   ABDOMINAL HYSTERECTOMY     APPENDECTOMY     KIDNEY SURGERY     TOTAL ABDOMINAL HYSTERECTOMY     Patient Active Problem List   Diagnosis Date Noted   Benign paroxysmal positional vertigo 08/02/2022   Abdominal pain 02/17/2022   H/O degenerative disc disease 02/17/2022   History of renal calculi 02/17/2022   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[place]) 02/17/2022   Lumbar radiculopathy, chronic 02/17/2022   Right lower lobe pulmonary nodule 02/17/2022   Nephrolithiasis 11/09/2020   Displacement of lumbar intervertebral disc without myelopathy 01/30/2018   Lumbosacral radiculitis 01/30/2018    PCP: Samuel Bouche REFERRING PROVIDER: Luetta Nutting  REFERRING DIAG: S97.02 (ICD-10-CM) - Benign paroxysmal positional vertigo of left ear  THERAPY DIAG:  Dizziness and giddiness  Unsteadiness on feet  ONSET DATE: 10/20/22  Rationale for Evaluation and Treatment: Rehabilitation  SUBJECTIVE:   SUBJECTIVE STATEMENT: Pt states she has wax build up in her R ear. Has gone to the doctor but they can't get it out. Does not have an appointment with ENT set up yet. Pt reports she started to get dizziness on Sunday. Pt states she woke up and was a little dizzy. This is her second incidence of vertigo. Pt reports she tried to do the exercises but then she felt  like her ear got blocked. Has been really dizzy ever since.  Pt accompanied by: self  PERTINENT HISTORY: First incidence of dizziness last year  PAIN:  Are you having pain? No  PRECAUTIONS: Fall  WEIGHT BEARING RESTRICTIONS: No  FALLS: Has patient fallen in last 6 months? No  LIVING ENVIRONMENT: Lives with: lives with their family Lives in: House/apartment  PLOF: Independent  PATIENT GOALS: Improve dizziness  OBJECTIVE:   DIAGNOSTIC FINDINGS: None  COGNITION: Overall cognitive status: Within functional limits for tasks assessed   SENSATION: WFL  POSTURE:  WFL  Cervical ROM:    Active A/PROM (deg) eval  Flexion 100%  Extension 100%  Right lateral flexion 100%  Left lateral flexion 100%  Right rotation   Left rotation   (Blank rows = not tested)  STRENGTH: WFL  LOWER EXTREMITY MMT:   MMT Right eval Left eval  Hip flexion    Hip abduction    Hip adduction    Hip internal rotation    Hip external rotation    Knee flexion    Knee extension    Ankle dorsiflexion    Ankle plantarflexion    Ankle inversion    Ankle eversion    (Blank rows = not tested)  BED MOBILITY:  Ind   TRANSFERS: Assistive device utilized: None  Sit to stand: Complete Independence Stand to sit: Complete Independence  Chair to chair: Complete Independence Floor:  N/T  GAIT: Distance walked: 100' Assistive device utilized: None Level of assistance: Complete Independence Comments: Guarded, wide BOS, hands up  PATIENT SURVEYS:  DHI: 70 / 100 = 70 % (P: 20 / 24 E: 18 / 36 F: 32 / 40)  VESTIBULAR ASSESSMENT:  GENERAL OBSERVATION: Highly guarded with head/neck movement. Guarded with amb.    SYMPTOM BEHAVIOR:  Subjective history: See above  Non-Vestibular symptoms: headaches  Type of dizziness: Spinning/Vertigo and "like I'm in water"  Frequency: Daily  Duration: 1 min  Aggravating factors: Induced by position change: lying supine, rolling to the right, rolling to  the left, supine to sit, and sit to stand, Induced by motion: looking up at the ceiling, bending down to the ground, and turning head quickly, Worse in the morning, and Moving eyes  Relieving factors: lying supine and medication  Progression of symptoms: unchanged  OCULOMOTOR EXAM:  Ocular Alignment: normal  Ocular ROM: No Limitations  Spontaneous Nystagmus: absent  Gaze-Induced Nystagmus: absent  Smooth Pursuits: intact  Saccades:  mild dizziness with horizontal  FRENZEL - FIXATION SUPRESSED:  Not available to test  VESTIBULAR - OCULAR REFLEX:   Slow VOR: Positive Left  VOR Cancellation: Normal  Head-Impulse Test: Did not test  Dynamic Visual Acuity: Did not test   POSITIONAL TESTING: Right Dix-Hallpike: (-) Left Dix-Hallpike: Unable to visualize nystagmus; however, pt symptomatic x ~23 sec Right Sidelying: (-) Left Sidelying: Mild apogeotropic nystagmus x 3 sec  MOTION SENSITIVITY:  Motion Sensitivity Quotient Intensity: 0 = none, 1 = Lightheaded, 2 = Mild, 3 = Moderate, 4 = Severe, 5 = Vomiting  Intensity  1. Sitting to supine   2. Supine to L side   3. Supine to R side   4. Supine to sitting   5. L Hallpike-Dix   6. Up from L    7. R Hallpike-Dix   8. Up from R    9. Sitting, head tipped to L knee   10. Head up from L knee   11. Sitting, head tipped to R knee   12. Head up from R knee   13. Sitting head turns x5   14.Sitting head nods x5   15. In stance, 180 turn to L    16. In stance, 180 turn to R     OTHOSTATICS: not done  FUNCTIONAL GAIT: Did not assess   VESTIBULAR TREATMENT:                                                                                                   DATE: 10/24/22  Canalith Repositioning:  Epley Left: Number of Reps: 2 and Response to Treatment: symptoms improved and Appiani/Gufoni Horizontal Apogeotropic: Number of Reps: 1 and Response to Treatment: symptoms improved   PATIENT EDUCATION: Education details: Exam findings,  POC, self Epley maneuvers Person educated: Patient Education method: Explanation, Demonstration, and Handouts Education comprehension: verbalized understanding and returned demonstration  HOME EXERCISE PROGRAM:  GOALS: Goals reviewed with patient? Yes  LONG TERM GOALS: Target date: 11/22/2022   Pt will be ind with  self management of BPPV Baseline:  Goal status: INITIAL  2.  Pt will have no symptoms in all canalith testing positions to demo resolution of BPPV Baseline:  Goal status: INITIAL  3.  Pt will report full resolution of dizziness Baseline:  Goal status: INITIAL  4.  Pt will report DHI of </=30 to demo only mild symptoms Baseline:  Goal status: INITIAL   ASSESSMENT:  CLINICAL IMPRESSION: Patient is a 57 y.o. F who was seen today for physical therapy evaluation and treatment for dizziness. Assessment significant for L posterior canalith and L horizontal canalith BPPV. Addressed with canalith repositioning this session. Pt would benefit from PT to address these deficits for full resolution of symptoms.   OBJECTIVE IMPAIRMENTS: Abnormal gait, decreased balance, difficulty walking, and dizziness.   ACTIVITY LIMITATIONS: bending, transfers, bed mobility, and locomotion level  PARTICIPATION LIMITATIONS: meal prep, cleaning, laundry, driving, shopping, community activity, occupation, and yard work  PERSONAL FACTORS: Past/current experiences and Time since onset of injury/illness/exacerbation are also affecting patient's functional outcome.   REHAB POTENTIAL: Good  CLINICAL DECISION MAKING: Stable/uncomplicated  EVALUATION COMPLEXITY: Low   PLAN:  PT FREQUENCY:  PRN  PT DURATION: 4 weeks  PLANNED INTERVENTIONS: Therapeutic exercises, Therapeutic activity, Neuromuscular re-education, Balance training, Gait training, Patient/Family education, Self Care, Joint mobilization, Vestibular training, Canalith repositioning, Manual therapy, and Re-evaluation  PLAN FOR  NEXT SESSION: Assess canalith. Reposition as indicated.    Reia Viernes April Ma L Kween Bacorn, PT 10/25/2022, 10:57 AM

## 2023-03-31 ENCOUNTER — Ambulatory Visit (INDEPENDENT_AMBULATORY_CARE_PROVIDER_SITE_OTHER): Payer: 59 | Admitting: Medical-Surgical

## 2023-03-31 DIAGNOSIS — R7989 Other specified abnormal findings of blood chemistry: Secondary | ICD-10-CM | POA: Diagnosis not present

## 2023-03-31 DIAGNOSIS — Z0000 Encounter for general adult medical examination without abnormal findings: Secondary | ICD-10-CM | POA: Diagnosis not present

## 2023-03-31 DIAGNOSIS — Z13220 Encounter for screening for lipoid disorders: Secondary | ICD-10-CM | POA: Diagnosis not present

## 2023-03-31 NOTE — Progress Notes (Signed)
Complete physical exam  Patient: Nicole Matthews   DOB: Jul 28, 1966   57 y.o. Female  MRN: [id number]  Subjective:    No chief complaint on file.  Nicole Matthews is a 57 y.o. female who presents today for a complete physical exam. She reports consuming a general diet.  Treadmill, bike, and walking for exercise.  She generally feels well. She reports sleeping poorly. She does not have additional problems to discuss today.    Most recent fall risk assessment:    01/08/2022    2:04 PM  Fall Risk   Falls in the past year? 0  Number falls in past yr: 0  Injury with Fall? 0  Risk for fall due to : No Fall Risks  Follow up Falls evaluation completed     Most recent depression screenings:    01/08/2022    2:03 PM  PHQ 2/9 Scores  PHQ - 2 Score 0  PHQ- 9 Score 0    Vision:Within last year, Dental: No current dental problems and Receives regular dental care, and STD: The patient denies history of sexually transmitted disease.    Patient Care Team: Christen Butter, NP as PCP - General (Nurse Practitioner) System, Provider Not In   Outpatient Medications Prior to Visit  Medication Sig   Acetaminophen (TYLENOL 8 HOUR PO) Take by mouth.   diazepam (VALIUM) 5 MG tablet Take 1 tablet (5 mg total) by mouth every 8 (eight) hours as needed (vertigo/dizziness).   esomeprazole (NEXIUM) 40 MG capsule Take by mouth.   Naproxen Sodium (ALEVE PO) Take by mouth.   neomycin-polymyxin-dexameth (MAXITROL) 0.1 % OINT    predniSONE (DELTASONE) 50 MG tablet Take 50mg  daily x5 days.   prochlorperazine (COMPAZINE) 10 MG tablet Take 1 tablet (10 mg total) by mouth every 6 (six) hours as needed for nausea or vomiting.   No facility-administered medications prior to visit.    Review of Systems  Constitutional:  Negative for chills, fever, malaise/fatigue and weight loss.  HENT:  Negative for congestion, ear pain, hearing loss, sinus pain and sore throat.   Eyes:  Negative for blurred vision, photophobia and  pain.  Respiratory:  Negative for cough, shortness of breath and wheezing.   Cardiovascular:  Negative for chest pain, palpitations and leg swelling.  Gastrointestinal:  Negative for abdominal pain, constipation, diarrhea, heartburn, nausea and vomiting.  Genitourinary:  Negative for dysuria, frequency and urgency.  Musculoskeletal:  Negative for falls and neck pain.  Skin:  Negative for itching and rash.  Neurological:  Negative for dizziness, weakness and headaches.  Endo/Heme/Allergies:  Negative for polydipsia. Does not bruise/bleed easily.  Psychiatric/Behavioral:  Negative for depression, substance abuse and suicidal ideas. The patient is not nervous/anxious and does not have insomnia.      Objective:    There were no vitals taken for this visit.   Physical Exam   No results found for any visits on 03/31/23.     Assessment & Plan:    Routine Health Maintenance and Physical Exam  Immunization History  Administered Date(s) Administered   Tdap 01/08/2022   Zoster Recombinat (Shingrix) 01/08/2022, 09/16/2022    Health Maintenance  Topic Date Due   COVID-19 Vaccine (1) Never done   HIV Screening  Never done   Hepatitis C Screening  Never done   INFLUENZA VACCINE  05/15/2023   MAMMOGRAM  03/28/2024   DTaP/Tdap/Td (2 - Td or Tdap) 01/09/2032   Colonoscopy  07/01/2032   Zoster Vaccines- Shingrix  Completed  HPV VACCINES  Aged Out    Discussed health benefits of physical activity, and encouraged her to engage in regular exercise appropriate for her age and condition.  1. Annual physical exam Checking labs as below.  Up-to-date on preventative care.  Wellness information provided with AVS. - COMPLETE METABOLIC PANEL WITH GFR - CBC with Differential/Platelet  2. Lipid screening Checking lipids today. - Lipid panel  3. Abnormal TSH History of abnormal TSH last year, rechecking today. - TSH  Return in about 1 year (around 03/30/2024) for annual physical exam.    Christen Butter, NP

## 2023-04-02 LAB — CBC WITH DIFFERENTIAL/PLATELET
Absolute Monocytes: 229 cells/uL (ref 200–950)
Basophils Absolute: 21 cells/uL (ref 0–200)
Basophils Relative: 0.4 %
Eosinophils Absolute: 62 cells/uL (ref 15–500)
Eosinophils Relative: 1.2 %
HCT: 37.3 % (ref 35.0–45.0)
Hemoglobin: 12.3 g/dL (ref 11.7–15.5)
Lymphs Abs: 2257 cells/uL (ref 850–3900)
MCH: 30 pg (ref 27.0–33.0)
MCHC: 33 g/dL (ref 32.0–36.0)
MCV: 91 fL (ref 80.0–100.0)
MPV: 10.8 fL (ref 7.5–12.5)
Monocytes Relative: 4.4 %
Neutro Abs: 2631 cells/uL (ref 1500–7800)
Neutrophils Relative %: 50.6 %
Platelets: 251 10*3/uL (ref 140–400)
RBC: 4.1 10*6/uL (ref 3.80–5.10)
RDW: 13.2 % (ref 11.0–15.0)
Total Lymphocyte: 43.4 %
WBC: 5.2 10*3/uL (ref 3.8–10.8)

## 2023-04-02 LAB — COMPLETE METABOLIC PANEL WITH GFR
AG Ratio: 1.9 (calc) (ref 1.0–2.5)
ALT: 17 U/L (ref 6–29)
AST: 16 U/L (ref 10–35)
Albumin: 4.5 g/dL (ref 3.6–5.1)
Alkaline phosphatase (APISO): 45 U/L (ref 37–153)
BUN: 14 mg/dL (ref 7–25)
CO2: 24 mmol/L (ref 20–32)
Calcium: 9.4 mg/dL (ref 8.6–10.4)
Chloride: 107 mmol/L (ref 98–110)
Creat: 0.8 mg/dL (ref 0.50–1.03)
Globulin: 2.4 g/dL (calc) (ref 1.9–3.7)
Glucose, Bld: 88 mg/dL (ref 65–99)
Potassium: 4.2 mmol/L (ref 3.5–5.3)
Sodium: 139 mmol/L (ref 135–146)
Total Bilirubin: 0.5 mg/dL (ref 0.2–1.2)
Total Protein: 6.9 g/dL (ref 6.1–8.1)
eGFR: 85 mL/min/{1.73_m2} (ref >=60)

## 2023-04-02 LAB — TSH: TSH: 0.71 mIU/L (ref 0.40–4.50)

## 2023-04-02 LAB — LIPID PANEL
Cholesterol: 237 mg/dL — ABNORMAL HIGH (ref <200)
HDL: 55 mg/dL (ref >=50)
LDL Cholesterol (Calc): 160 mg/dL (calc) — ABNORMAL HIGH
Non-HDL Cholesterol (Calc): 182 mg/dL (calc) — ABNORMAL HIGH (ref <130)
Total CHOL/HDL Ratio: 4.3 (calc) (ref <5.0)
Triglycerides: 106 mg/dL (ref <150)

## 2023-06-02 ENCOUNTER — Encounter: Payer: Self-pay | Admitting: Medical-Surgical

## 2023-06-02 ENCOUNTER — Ambulatory Visit (INDEPENDENT_AMBULATORY_CARE_PROVIDER_SITE_OTHER): Payer: 59 | Admitting: Medical-Surgical

## 2023-06-02 VITALS — BP 127/74 | HR 75 | Resp 20 | Ht 65.0 in | Wt 174.9 lb

## 2023-06-02 DIAGNOSIS — Z710 Person encountering health services to consult on behalf of another person: Secondary | ICD-10-CM | POA: Insufficient documentation

## 2023-06-02 DIAGNOSIS — B372 Candidiasis of skin and nail: Secondary | ICD-10-CM

## 2023-06-02 MED ORDER — NYSTATIN POWD: 11 refills | Status: DC

## 2023-06-02 MED ORDER — CLOTRIMAZOLE-BETAMETHASONE 1-0.05 % EX CREA: 1.0000 | TOPICAL_CREAM | Freq: Two times a day (BID) | CUTANEOUS | 0 refills | Status: AC

## 2023-06-02 NOTE — Progress Notes (Signed)
        Established patient visit  History, exam, impression, and plan:  1. Candidiasis of skin Pleasant 57 year old female presenting today with reports of burning and itching in her armpits for the last week.  Notes that it started as slight irritation and burning however has progressed.  She has been scratching at it because it itches so bad which has worsened the situation.  Notes that she is menopausal and frequently sweats so has a hard time keeping her axillary areas dry.  No recent changes in cosmetics, detergents, deodorant, body wash, etc.  On exam, skin in the axillary region is very irritated, erythematous, and swollen.  Mild green fluorescence noted with Joseph Art lamp evaluation.  No open lesions, pustules, vesicles, or crusting.  Treating with Lotrisone twice daily for up to 14 days.  Okay to use nystatin powder twice daily between Lotrisone application.  Work to keep the axillary region as dry as possible and continue to monitor symptoms.  If no improvement in the next week, advised her to reach out and let me know.   Procedures performed this visit: None.  Return if symptoms worsen or fail to improve.  __________________________________ Thayer Ohm, DNP, APRN, FNP-BC Primary Care and Sports Medicine [facility]

## 2023-07-14 ENCOUNTER — Telehealth: Payer: Self-pay | Admitting: *Deleted

## 2023-07-14 NOTE — Telephone Encounter (Signed)
Patient given appointment information

## 2023-07-16 ENCOUNTER — Ambulatory Visit (INDEPENDENT_AMBULATORY_CARE_PROVIDER_SITE_OTHER): Payer: 59 | Admitting: Obstetrics and Gynecology

## 2023-07-16 ENCOUNTER — Encounter: Payer: Self-pay | Admitting: Obstetrics and Gynecology

## 2023-07-16 VITALS — BP 148/83 | HR 69 | Ht 65.0 in | Wt 175.0 lb

## 2023-07-16 DIAGNOSIS — N951 Menopausal and female climacteric states: Secondary | ICD-10-CM

## 2023-07-16 DIAGNOSIS — R21 Rash and other nonspecific skin eruption: Secondary | ICD-10-CM | POA: Diagnosis not present

## 2023-07-16 DIAGNOSIS — Z01419 Encounter for gynecological examination (general) (routine) without abnormal findings: Secondary | ICD-10-CM | POA: Diagnosis not present

## 2023-07-16 MED ORDER — TRIAMCINOLONE ACETONIDE 0.1 % EX LOTN
1.0000 | TOPICAL_LOTION | Freq: Two times a day (BID) | CUTANEOUS | 0 refills | Status: DC
Start: 2023-07-16 — End: 2023-10-03

## 2023-07-16 MED ORDER — GABAPENTIN 600 MG PO TABS
300.0000 mg | ORAL_TABLET | Freq: Every day | ORAL | 3 refills | Status: DC
Start: 2023-07-16 — End: 2023-10-03

## 2023-07-16 NOTE — Progress Notes (Signed)
ANNUAL EXAM Patient name: Nicole Matthews MRN [id number]  Date of birth: 18-Jun-1966 Chief Complaint:   Annual Exam  History of Present Illness:   Nicole Matthews is a 57 y.o. [phone] female being seen today for a routine annual exam.   Discussed the use of AI scribe software for clinical note transcription with the patient, who gave verbal consent to proceed.  History of Present Illness   The patient, with a history of hysterectomy (1984) due to fibroids and cysts on the ovaries, presents for an annual exam. The patient reports bilateral itching in the breast and underarm area for the past month. The itching initially improved with a prescribed cream but has since returned. The patient has tried changing deodorants and using over-the-counter cortisone cream, but these interventions have not alleviated the itching.  In addition to the itching, the patient has been experiencing hot flashes for approximately 15-16 years, which are particularly bothersome at night and disrupt sleep. The patient reports excessive sweating, leading to concerns about body odor and frequent showers. The patient has not been taking any medications for these symptoms due to fear of potential side effects, including cancer.     No LMP recorded. Patient has had a hysterectomy.   Last MXR: Due Last Pap/Pap History: No longer indicated due to hysterectomy   Health Maintenance Due  Topic Date Due   HIV Screening  Never done   Hepatitis C Screening  Never done   COVID-19 Vaccine (1 - 2023-24 season) Never done    Review of Systems:   Pertinent items are noted in HPI Denies any headaches, blurred vision, fatigue, shortness of breath, chest pain, abdominal pain, abnormal vaginal discharge/itching/odor/irritation, bowel movements, urination, or intercourse unless otherwise stated above.  Pertinent History Reviewed:  Reviewed past medical,surgical, social and family history.  Reviewed problem list, medications and  allergies. Physical Assessment:   Vitals:   07/16/23 0903 07/16/23 0931  BP: (!) 148/93 (!) 148/83  Pulse: 69   Weight: 175 lb (79.4 kg)   Height: 5\' 5"  (1.651 m)   Body mass index is 29.12 kg/m.   Physical Examination:  General appearance - well appearing, and in no distress Mental status - alert, oriented to person, place, and time Psych:  She has a normal mood and affect Skin - warm and dry, normal color, no suspicious lesions noted Chest - effort normal Heart - normal rate  Breasts - breasts appear normal, no suspicious masses, no skin or nipple changes or axillary nodes. Faint rash below the axilla along her side wall. No redness or breaks in the skin.  Abdomen - soft, nontender, nondistended, no masses or organomegaly Pelvic -  VULVA: normal appearing vulva with no masses, tenderness or lesions  VAGINA: normal appearing vagina with normal color and discharge, no lesions  CERVIX: Surgically absent UTERUS: Surgically absent ADNEXA: No adnexal masses or tenderness noted. Extremities:  No swelling or varicosities noted  Chaperone present for exam  No results found for this or any previous visit (from the past 24 hour(s)).  Assessment & Plan:  Nicole Matthews was seen today for annual exam.  Diagnoses and all orders for this visit:  Well woman exam - Cervical cancer screening: Discussed guidelines. Pap no longer indicated due to hysterectomy for benign reasons.  - Breast Health: Encouraged self breast awareness/SBE. Discussed limits of clinical breast exam for detecting breast cancer. Discussed importance of annual MXR. Rx given for MXR - Climacteric/Sexual health: Reviewed typical and atypical symptoms of menopause/peri-menopause.  Discussed PMB and to call if any amount of spotting.  - Colonoscopy: Per PCP - F/U 12 months and prn  -     MM Digital Screening; Future  Skin rash Persistent itching in bilateral axillary and breast regions, unresponsive to previous topical  treatments. Likely secondary to excessive scratching and possible skin dryness from frequent bathing. -Prescribe stronger topical steroid ointment with a tapering schedule over 4-6 weeks to break the itch-scratch cycle. -Advise to reduce frequency of bathing to prevent skin dryness. -     triamcinolone lotion (KENALOG) 0.1 %; Apply 1 Application topically 2 (two) times daily. Apply twice daily for 2 weeks, then nightly for 2 weeks then twice a week for 2 weeks  Hot flashes due to menopause Persistent hot flashes for approximately 15-16 years, particularly bothersome at night, leading to excessive sweating and sleep disturbances. -Start Gabapentin 300mg  at bedtime to alleviate nighttime hot flashes and improve sleep. -Consider dose adjustment based on patient's response and tolerance. -     gabapentin (NEURONTIN) 600 MG tablet; Take 0.5 tablets (300 mg total) by mouth at bedtime.   Orders Placed This Encounter  Procedures   MM Digital Screening    Meds:  Meds ordered this encounter  Medications   gabapentin (NEURONTIN) 600 MG tablet    Sig: Take 0.5 tablets (300 mg total) by mouth at bedtime.    Dispense:  30 tablet    Refill:  3   triamcinolone lotion (KENALOG) 0.1 %    Sig: Apply 1 Application topically 2 (two) times daily. Apply twice daily for 2 weeks, then nightly for 2 weeks then twice a week for 2 weeks    Dispense:  60 mL    Refill:  0    Follow-up: No follow-ups on file.  Milas Hock, MD 07/16/2023 9:39 AM

## 2023-09-03 ENCOUNTER — Ambulatory Visit: Payer: 59

## 2023-09-03 DIAGNOSIS — Z01419 Encounter for gynecological examination (general) (routine) without abnormal findings: Secondary | ICD-10-CM

## 2023-10-03 ENCOUNTER — Ambulatory Visit (INDEPENDENT_AMBULATORY_CARE_PROVIDER_SITE_OTHER): Payer: 59 | Admitting: Medical-Surgical

## 2023-10-03 ENCOUNTER — Ambulatory Visit: Payer: 59

## 2023-10-03 ENCOUNTER — Encounter: Payer: Self-pay | Admitting: Medical-Surgical

## 2023-10-03 VITALS — BP 123/81 | HR 69 | Resp 20 | Ht 65.0 in | Wt 178.0 lb

## 2023-10-03 DIAGNOSIS — M25531 Pain in right wrist: Secondary | ICD-10-CM | POA: Diagnosis not present

## 2023-10-03 DIAGNOSIS — M778 Other enthesopathies, not elsewhere classified: Secondary | ICD-10-CM

## 2023-10-03 MED ORDER — PREDNISONE 50 MG PO TABS: 50.0000 mg | ORAL_TABLET | Freq: Every day | ORAL | 0 refills | Status: DC

## 2023-10-03 MED ORDER — MELOXICAM 15 MG PO TABS: 15.0000 mg | ORAL_TABLET | Freq: Every day | ORAL | 0 refills | Status: DC

## 2023-10-03 NOTE — Progress Notes (Signed)
        Established patient visit  History, exam, impression, and plan:  1. Right wrist tendinitis (Primary) Very pleasant 57 year old female presenting today with complaints of right wrist and hand pain with swelling.  This has been going on for 2 months but seems to be getting worse.  She is having pain that extends up her forearm and into the back of her hand.  Also having pain over the thumb.  On exam, negative Tinel's and Phalen's.  Positive Finkelstein's.  Symptoms consistent with tendinitis of the right wrist and de Quervain's tenosynovitis.  Treating with prednisone 50 mg daily for 5 days then switching to meloxicam 15 mg daily.  Placed in right wrist brace with thumb spica.  Instructed to wear this during the day but may remove at night.  Okay to use heat, ice, Tylenol, massage, etc. for pain management.  Getting x-rays today to rule out bony abnormalities.  Exercises provided for home completion.  Plan to return in 4 weeks to evaluate response.   - DG Wrist Complete Right; Future   Procedures performed this visit: None.  Return in about 4 weeks (around 10/31/2023) for right wrist pain follow up.  __________________________________ Thayer Ohm, DNP, APRN, FNP-BC Primary Care and Sports Medicine [facility] [place]

## 2023-10-13 ENCOUNTER — Encounter: Payer: Self-pay | Admitting: Medical-Surgical

## 2023-10-13 ENCOUNTER — Ambulatory Visit (INDEPENDENT_AMBULATORY_CARE_PROVIDER_SITE_OTHER): Payer: 59 | Admitting: Medical-Surgical

## 2023-10-13 VITALS — BP 130/80 | HR 92 | Resp 20 | Ht 65.0 in | Wt 178.3 lb

## 2023-10-13 DIAGNOSIS — J40 Bronchitis, not specified as acute or chronic: Secondary | ICD-10-CM

## 2023-10-13 DIAGNOSIS — J329 Chronic sinusitis, unspecified: Secondary | ICD-10-CM | POA: Diagnosis not present

## 2023-10-13 MED ORDER — AMOXICILLIN-POT CLAVULANATE 875-125 MG PO TABS: 1.0000 | ORAL_TABLET | Freq: Two times a day (BID) | ORAL | 0 refills | Status: DC

## 2023-10-13 MED ORDER — FLUCONAZOLE 150 MG PO TABS: 150.0000 mg | ORAL_TABLET | Freq: Once | ORAL | 0 refills | Status: AC

## 2023-10-13 NOTE — Progress Notes (Signed)
        Established patient visit  History, exam, impression, and plan:  1. Sinobronchitis (Primary) Pleasant 57 year old female presenting today with reports of an abnormal smell that she describes as smelling like cigarette smoke.  This has been constant and has lasted since she completed her prednisone taper at the beginning of this month.  She has a cough that is nonproductive as well as some throat irritation.  Has pressure in her face and around her sinuses.  No fevers or chills.  No shortness of breath or chest pain.  On exam, she has mild posterior oropharyngeal erythema along with maxillary sinus tenderness.  Suspect bacterial infection is causing the abnormal smell.  Plan to treat with Augmentin twice daily x 10 days.  Adding Diflucan for VVC prophylaxis.  Procedures performed this visit: None.  Return if symptoms worsen or fail to improve.  __________________________________ Thayer Ohm, DNP, APRN, FNP-BC Primary Care and Sports Medicine [facility] [place]

## 2023-10-16 ENCOUNTER — Ambulatory Visit: Payer: 59 | Admitting: Medical-Surgical

## 2023-10-31 ENCOUNTER — Ambulatory Visit (INDEPENDENT_AMBULATORY_CARE_PROVIDER_SITE_OTHER): Payer: 59 | Admitting: Medical-Surgical

## 2023-10-31 ENCOUNTER — Encounter: Payer: Self-pay | Admitting: Medical-Surgical

## 2023-10-31 VITALS — BP 125/76 | HR 83 | Resp 20 | Ht 65.0 in | Wt 179.2 lb

## 2023-10-31 DIAGNOSIS — F4321 Adjustment disorder with depressed mood: Secondary | ICD-10-CM

## 2023-10-31 DIAGNOSIS — M778 Other enthesopathies, not elsewhere classified: Secondary | ICD-10-CM

## 2023-10-31 NOTE — Progress Notes (Signed)
        Established patient visit  History, exam, impression, and plan:  1. Right wrist tendinitis (Primary) Pleasant 58 year old female presenting today for follow-up on right wrist tendinitis.  Since our last visit she has been taking Aleve, doing home exercises, wearing the brace that was provided, and using icing.  Reports that her symptoms are at least 60% improved and she is happy with the progress.  On exam, she does still have positive Finkelstein's test but this is also somewhat improved.  Recommend continuing with bracing as needed.  Continue Aleve 1 tablet twice daily as needed.  Make sure to do home exercises regularly.  Monitor symptoms and if they worsen or fail to fully resolve, return for further evaluation.  Discussed possible referral to formal physical therapy but we will hold off today.  2. Grief Reports that her father passed away at the end of October 26, 2024 and she had to travel to [place] to be with family.  She just returned yesterday.  Reports that she is feeling understandably heartbroken but denies need for intervention at this time.  Advised her to reach out should she feel like counseling or medication is needed to help her manage.  Procedures performed this visit: None.  Return if symptoms worsen or fail to improve.  __________________________________ Thayer Ohm, DNP, APRN, FNP-BC Primary Care and Sports Medicine [facility] [place]

## 2024-01-29 IMAGING — DX DG ABDOMEN 2V
3 series · 3 of 3 positions shown · non-contrast
Comparison: None.

CLINICAL DATA: Left-sided abdominal pain for few days

EXAM:
ABDOMEN - 2 VIEW

[abdomen erect]
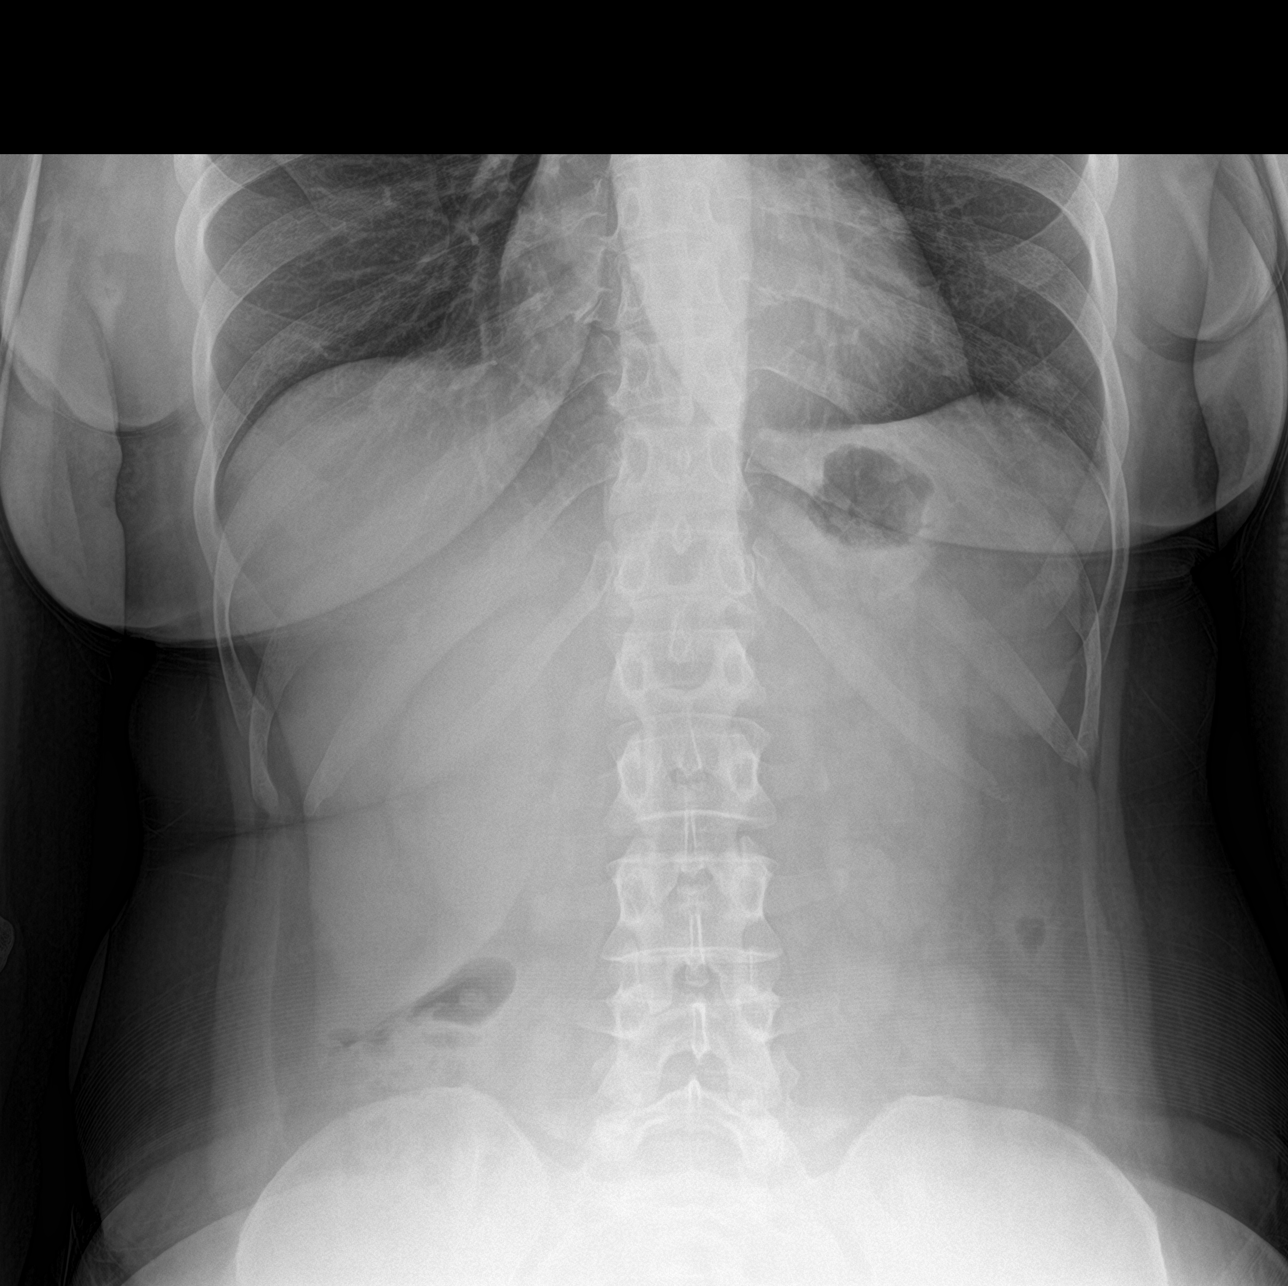

[abdomen supine (1 of 2)]
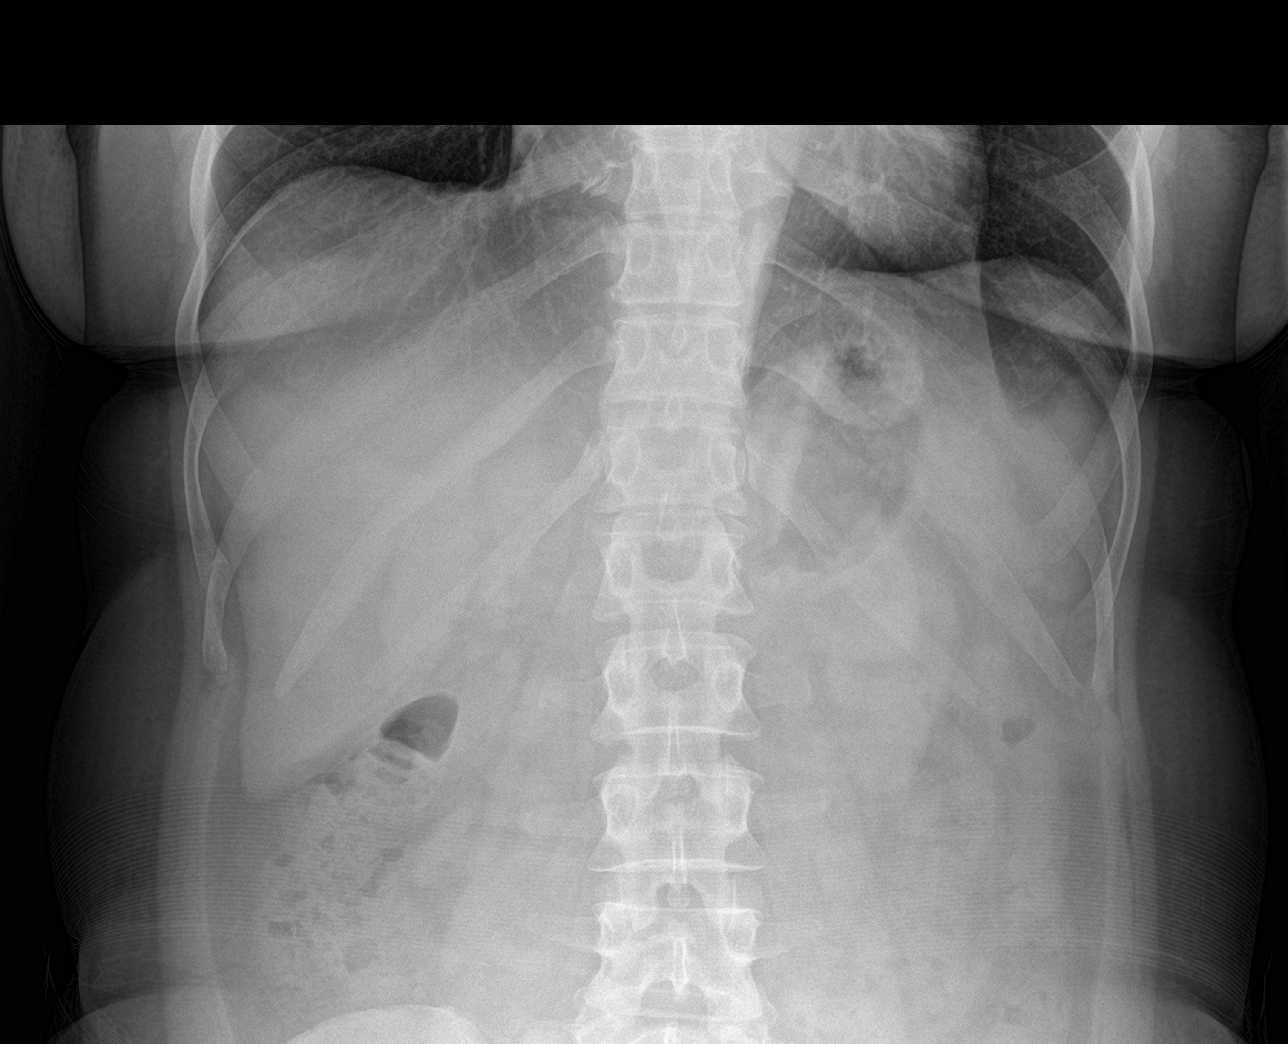

[abdomen supine (2 of 2)]
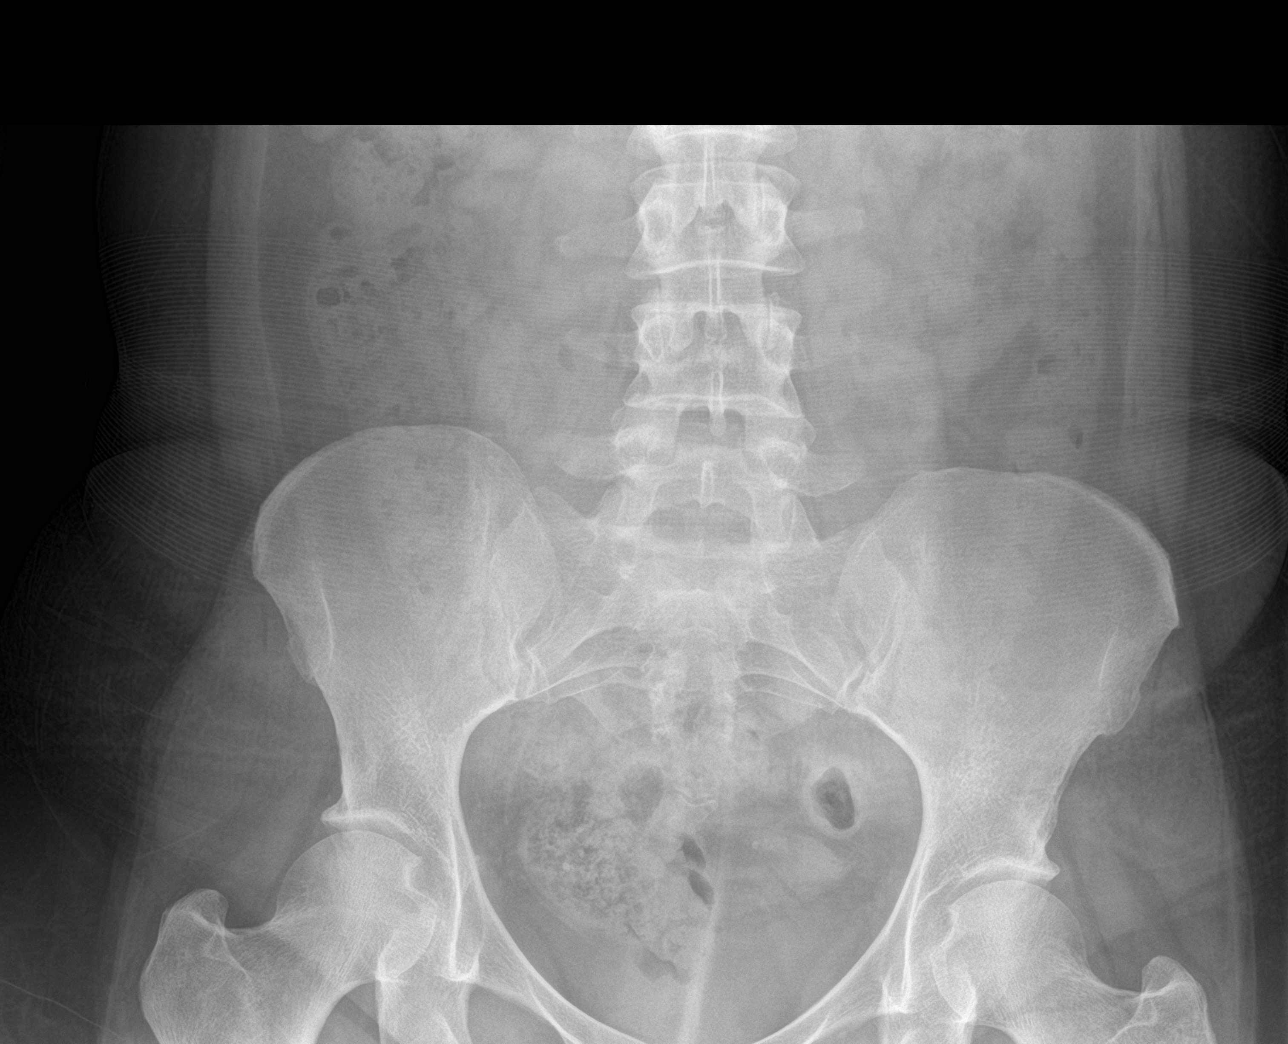

[3 of 3 positions shown; findings below may reference images not displayed]

FINDINGS: Nonobstructive pattern of bowel gas. Moderate burden of stool in the
left and right colon. There is no evidence of free air. No
radio-opaque calculi or other significant radiographic abnormality
is seen.
IMPRESSION: Nonobstructive pattern of bowel gas. Moderate burden of stool in the
left and right colon.

## 2024-06-15 ENCOUNTER — Encounter: Payer: Self-pay | Admitting: Sports Medicine

## 2024-08-29 ENCOUNTER — Encounter: Payer: Self-pay | Admitting: Medical-Surgical

## 2024-08-30 ENCOUNTER — Encounter: Payer: Self-pay | Admitting: Medical-Surgical

## 2024-08-30 ENCOUNTER — Ambulatory Visit: Admitting: Medical-Surgical

## 2024-08-30 ENCOUNTER — Ambulatory Visit: Payer: Self-pay | Admitting: Medical-Surgical

## 2024-08-30 VITALS — BP 134/78 | HR 73 | Resp 20 | Ht 65.0 in | Wt 179.0 lb

## 2024-08-30 DIAGNOSIS — J069 Acute upper respiratory infection, unspecified: Secondary | ICD-10-CM

## 2024-08-30 DIAGNOSIS — H6993 Unspecified Eustachian tube disorder, bilateral: Secondary | ICD-10-CM

## 2024-08-30 DIAGNOSIS — G4483 Primary cough headache: Secondary | ICD-10-CM | POA: Diagnosis not present

## 2024-08-30 DIAGNOSIS — R42 Dizziness and giddiness: Secondary | ICD-10-CM | POA: Diagnosis not present

## 2024-08-30 LAB — POC COVID19/FLU A&B COMBO
Covid Antigen, POC: NEGATIVE
Influenza A Antigen, POC: NEGATIVE
Influenza B Antigen, POC: NEGATIVE

## 2024-08-30 MED ORDER — CETIRIZINE HCL 10 MG PO TABS: 10.0000 mg | ORAL_TABLET | Freq: Every day | ORAL | 11 refills | Status: AC

## 2024-08-30 MED ORDER — DEXAMETHASONE 4 MG PO TABS: 4.0000 mg | ORAL_TABLET | Freq: Two times a day (BID) | ORAL | 0 refills | Status: AC

## 2024-08-30 MED ORDER — MECLIZINE HCL 25 MG PO TABS: 25.0000 mg | ORAL_TABLET | Freq: Three times a day (TID) | ORAL | 0 refills | Status: AC | PRN

## 2024-08-30 MED ORDER — TRIAMCINOLONE ACETONIDE 55 MCG/ACT NA AERO: 2.0000 | INHALATION_SPRAY | Freq: Every day | NASAL | 11 refills | Status: AC

## 2024-08-30 NOTE — Progress Notes (Unsigned)
        Established patient visit   History of Present Illness   Discussed the use of AI scribe software for clinical note transcription with the patient, who gave verbal consent to proceed.  History of Present Illness   Nicole Matthews is a 58 year old female who presents with headache, dizziness, and ear fullness.  Headache and ear fullness - Onset Friday morning - Frontal headache with sensation of tightness - Ear fullness described as a sensation of being 'underwater' - Loud sounds in ears - Tinnitus present - No fever or chills  Dizziness and vertigo - Dizziness present since symptom onset - History of vertigo with relief from positional maneuvers such as hanging head over the bed  Cough - Persistent non-productive cough began concurrently with other symptoms - Over-the-counter night and day pills have provided slight relief of cough - COVID test negative   Physical Exam   Physical Exam Vitals reviewed.  Constitutional:      General: She is not in acute distress.    Appearance: Normal appearance. She is well-developed. She is ill-appearing.  HENT:     Head: Normocephalic and atraumatic.  Eyes:     Extraocular Movements: Extraocular movements intact.     Pupils: Pupils are equal, round, and reactive to light.  Cardiovascular:     Rate and Rhythm: Normal rate and regular rhythm.     Pulses: Normal pulses.     Heart sounds: Normal heart sounds. No murmur heard.    No friction rub. No gallop.  Pulmonary:     Effort: Pulmonary effort is normal. No respiratory distress.     Breath sounds: Normal breath sounds. No wheezing.  Musculoskeletal:     Cervical back: Neck supple.  Lymphadenopathy:     Cervical: Cervical adenopathy present.  Skin:    General: Skin is warm and dry.  Neurological:     Mental Status: She is alert and oriented to person, place, and time.  Psychiatric:        Mood and Affect: Mood normal.        Behavior: Behavior normal.        Thought  Content: Thought content normal.        Judgment: Judgment normal.    Assessment & Plan   Acute viral upper respiratory infection with eustachian tube dysfunction Symptoms suggest viral etiology with eustachian tube dysfunction causing fluid accumulation. Considered vestibular migraine due to headache and tinnitus severity. - Swabbed for flu and COVID- negative results. - Prescribed Nasacort  nasal spray, one spray each nostril once daily. - Prescribed meclizine for dizziness and nausea, up to three times daily as needed. - Prescribed Decadron twice daily for five days. - Adding cetirizine 10mg  daily.  - Advised use of acetaminophen/ibuprofen or OTC cough/cold medications for pain management.  Vertigo Dizziness and nausea attributed to eustachian tube dysfunction and head pressure. - Prescribed meclizine for dizziness and nausea, up to three times daily as needed.   Follow up   Return if symptoms worsen or fail to improve. __________________________________ Zada FREDRIK Palin, DNP, APRN, FNP-BC Primary Care and Sports Medicine [facility] [place]

## 2024-08-30 NOTE — Telephone Encounter (Signed)
 Patient scheduled.

## 2024-09-07 ENCOUNTER — Ambulatory Visit: Payer: Self-pay

## 2024-09-07 NOTE — Telephone Encounter (Signed)
 FYI Only or Action Required?: Action required by provider: update on patient condition.- pt requesting atbx, refused appt for tomorrow at another location  Patient was last seen in primary care on 08/30/2024 by Willo Mini, NP.  Called Nurse Triage reporting Facial Pain.  Symptoms began several weeks ago.  Interventions attempted: OTC medications: tylenol and Prescription medications: steroid.  Symptoms are: unchanged.  Triage Disposition: See PCP When Office is Open (Within 3 Days)  Patient/caregiver understands and will follow disposition?: No, wishes to speak with PCP   Copied from CRM #[id number]. Topic: Clinical - Medication Question >> Sep 07, 2024  4:22 PM Yolanda T wrote: Reason for CRM: patient said she came in last Friday and was given a steroid for a sinus infection but the medication did not help. She says she still has a headache and feeling like she is under water. She's now asking for an antibiotic for her symptoms Reason for Disposition  [1] Sinus congestion (pressure, fullness) AND [2] present > 10 days  Answer Assessment - Initial Assessment Questions Pt states that she saw [facility] last Friday and was prescribed a steroid for 5 days. She states it relieved some of the pressure slightly but not much and now is having the exact same symptoms as she was prior to the appt. She states she feels like she is underwater, having pressure in her head and eyes. States she has nasal congestion but nothing comes up and can feel phlegm in her throat but nothing comes up. She denies any fever, shortness of breath or chest pain. Rn offered pt appt at another location for tomorrow. Patient refused. States that she would like to stay with Dr. Willo as she knows what is going on and has already seen her once regarding this. RN gave instructions on when to go to the ER. Pt stated understanding.     1. LOCATION: Where does it hurt?      Head and eyes 2. ONSET: When did the sinus pain  start?  (e.g., hours, days)      Prior to last appt 3. SEVERITY: How bad is the pain?   (Scale 0-10; or none, mild, moderate or severe)     8-9/10 4. NASAL CONGESTION: Is the nose blocked? If Yes, ask: Can you open it or must you breathe through your mouth?     Yes but states can't get anything out 5. NASAL DISCHARGE: Do you have discharge from your nose? If so ask, What color?     denies 6. FEVER: Do you have a fever? If Yes, ask: What is it, how was it measured, and when did it start?      denies 8. OTHER SYMPTOMS: Do you have any other symptoms? (e.g., sore throat, cough, earache, difficulty breathing)     Dizziness is not as severe she stated  Protocols used: Sinus Pain or Congestion-A-AH

## 2024-09-08 ENCOUNTER — Other Ambulatory Visit: Payer: Self-pay | Admitting: Medical-Surgical

## 2024-09-08 MED ORDER — AMOXICILLIN-POT CLAVULANATE 875-125 MG PO TABS: 1.0000 | ORAL_TABLET | Freq: Two times a day (BID) | ORAL | 0 refills | Status: AC

## 2024-10-20 ENCOUNTER — Other Ambulatory Visit: Payer: Self-pay | Admitting: Obstetrics and Gynecology

## 2024-10-20 DIAGNOSIS — Z1231 Encounter for screening mammogram for malignant neoplasm of breast: Secondary | ICD-10-CM

## 2024-11-04 ENCOUNTER — Ambulatory Visit (HOSPITAL_BASED_OUTPATIENT_CLINIC_OR_DEPARTMENT_OTHER)
Admission: RE | Admit: 2024-11-04 | Discharge: 2024-11-04 | Disposition: A | Discharge status: Home / Self Care | Source: Ambulatory Visit | Attending: Obstetrics and Gynecology | Admitting: Obstetrics and Gynecology

## 2024-11-04 ENCOUNTER — Encounter (HOSPITAL_BASED_OUTPATIENT_CLINIC_OR_DEPARTMENT_OTHER): Payer: Self-pay

## 2024-11-04 DIAGNOSIS — Z1231 Encounter for screening mammogram for malignant neoplasm of breast: Secondary | ICD-10-CM | POA: Insufficient documentation

## 2024-11-12 ENCOUNTER — Ambulatory Visit

## 2024-11-25 ENCOUNTER — Ambulatory Visit: Admitting: Obstetrics and Gynecology
